# Patient Record
Sex: Male | Born: 1945 | Race: White | Hispanic: No
Health system: Southern US, Community
[De-identification: ages and names within clinical notes are randomized; demographics above are authoritative.]

## PROBLEM LIST (undated history)

## (undated) DIAGNOSIS — I10 Essential (primary) hypertension: Secondary | ICD-10-CM

## (undated) DIAGNOSIS — F0390 Unspecified dementia without behavioral disturbance: Secondary | ICD-10-CM

## (undated) DIAGNOSIS — I4891 Unspecified atrial fibrillation: Secondary | ICD-10-CM

## (undated) DIAGNOSIS — E7800 Pure hypercholesterolemia, unspecified: Secondary | ICD-10-CM

---

## 2003-10-23 ENCOUNTER — Ambulatory Visit (HOSPITAL_COMMUNITY): Admission: RE | Admit: 2003-10-23 | Discharge: 2003-10-23 | Payer: Self-pay | Admitting: Orthopedic Surgery

## 2004-01-29 ENCOUNTER — Observation Stay (HOSPITAL_COMMUNITY): Admission: RE | Admit: 2004-01-29 | Discharge: 2004-01-30 | Payer: Self-pay | Admitting: Orthopedic Surgery

## 2004-11-08 IMAGING — CR DG CHEST 2V
2 series · 2 of 2 positions shown · non-contrast
Comparison: No previous study for comparison.

CLINICAL DATA: Smoker.  Preoperative respiratory exam.
 CHEST, TWO VIEWS

[view not recorded (1 of 2)]
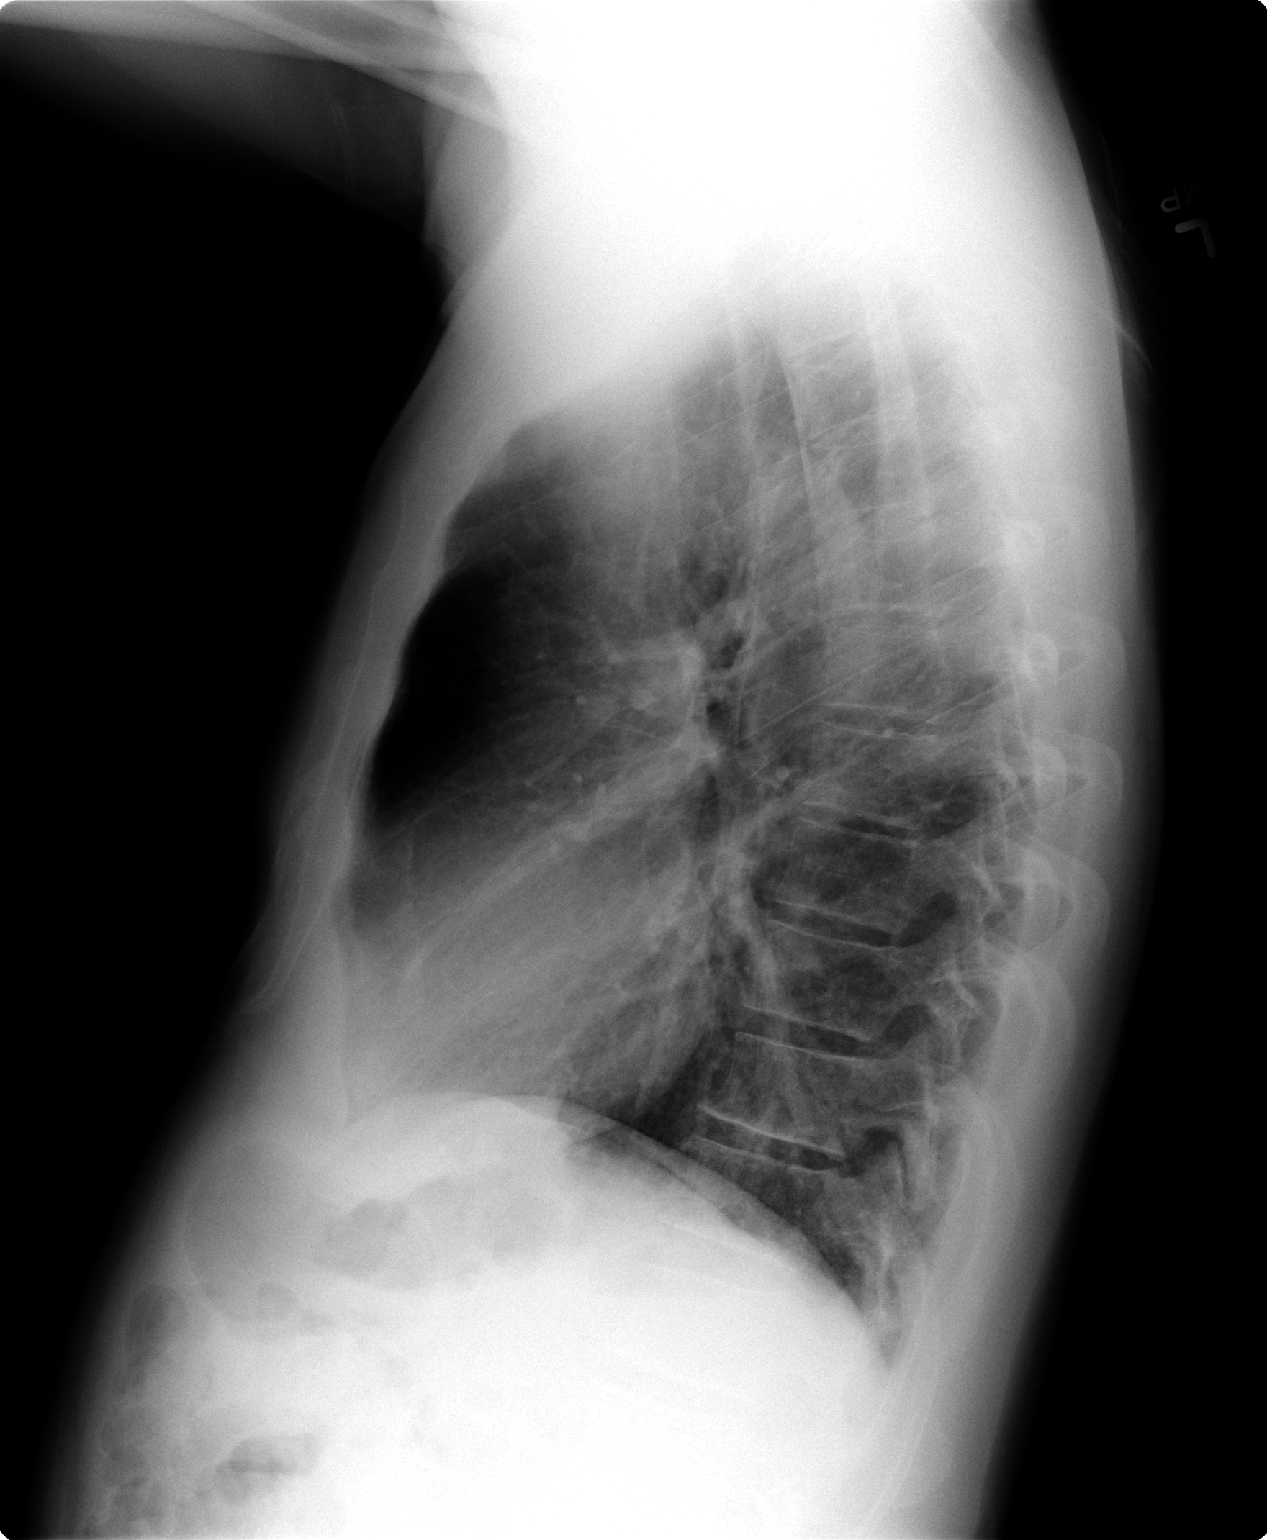

[view not recorded (2 of 2)]
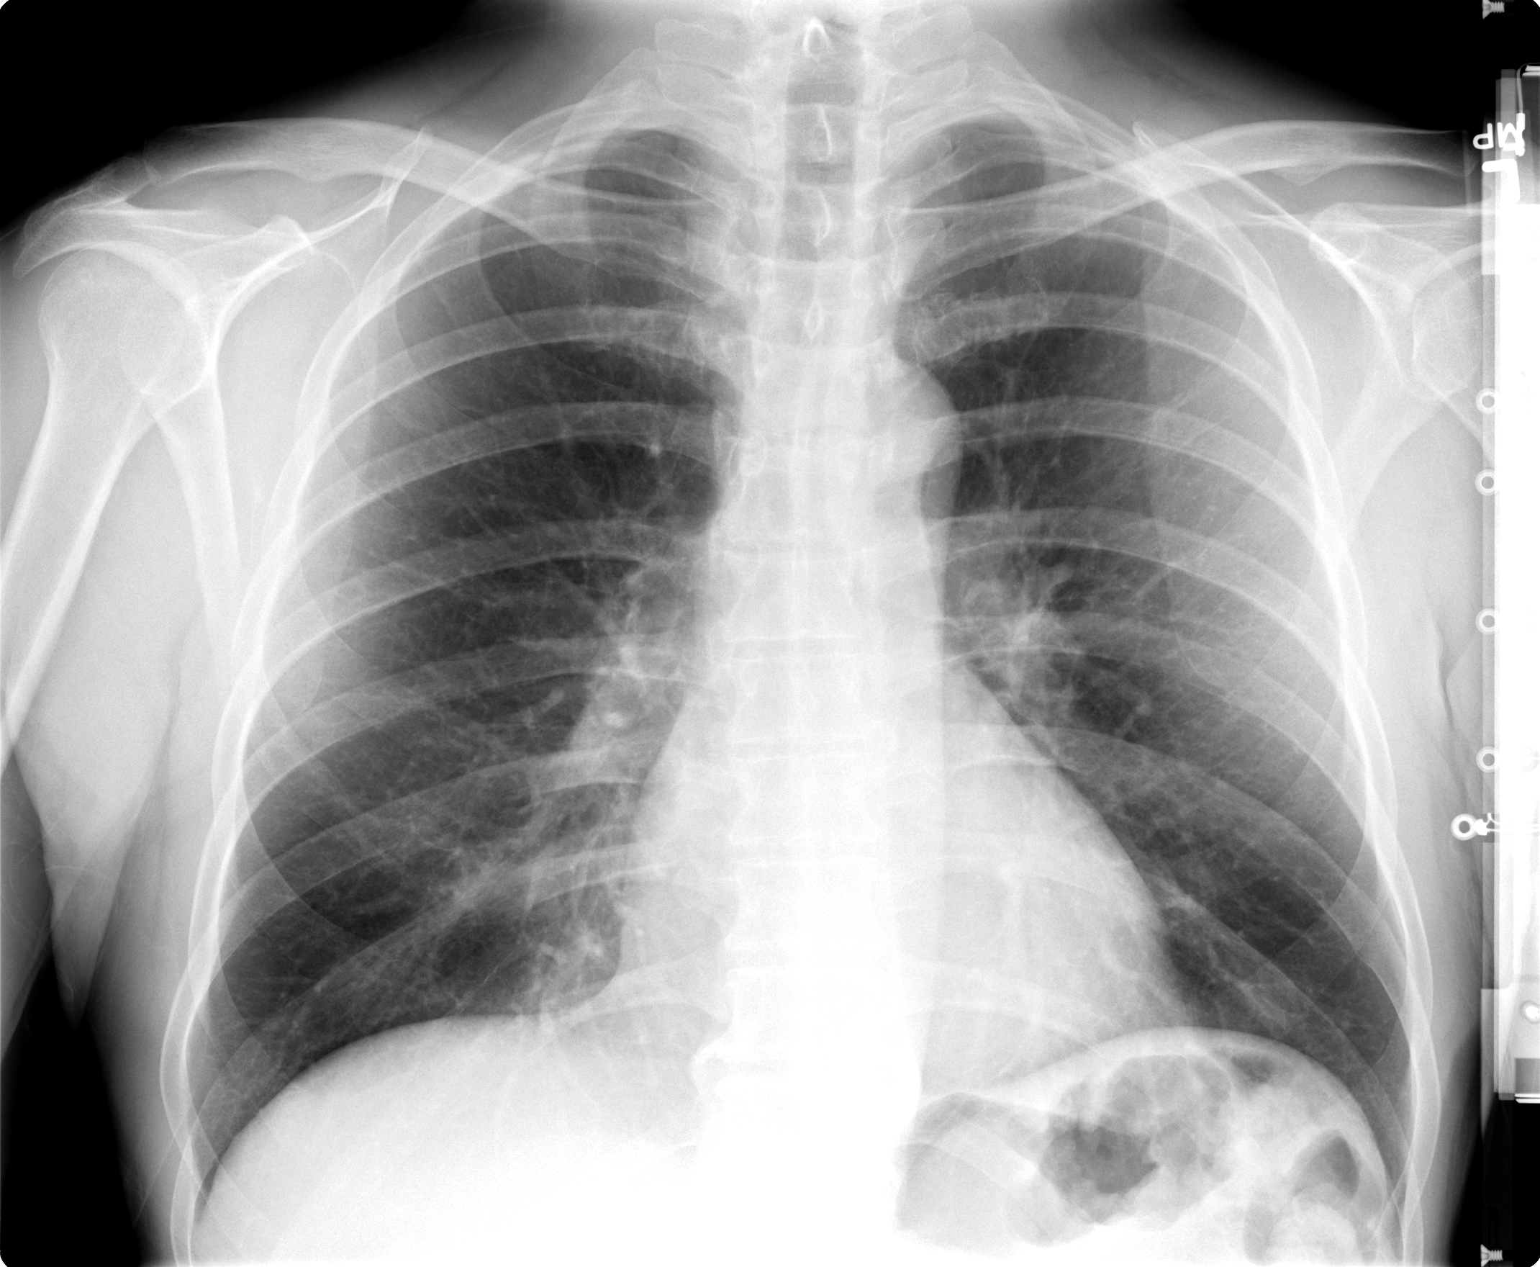

[2 of 2 positions shown; findings below may reference images not displayed]

The heart size and mediastinal contours are normal.  The lungs are clear.  The visualized skeleton is unremarkable. 
 IMPRESSION
 No active disease.

## 2021-05-03 NOTE — Progress Notes (Signed)
 Dear No primary care provider on file.,   Thank you for the opportunity to see Adam Stephens in neurological consultation.  Below, you will find my History and Physical report.  Please do not hesitate to contact my office if you have any questions or concerns.  Thank you for allowing me to participate in his care with you.    Sincerely, Norleen HILARIO Eagles, MD  Subjective   Patient ID:  Adam Stephens is a 75 y.o. (DOB [birth year]) male with a PMH of HTN, HLD, prior CVA, dementia who presents for falls.  His significant other is with him and gives much of the history.  He has been having balance issues going back for about 2 years.  She thinks he has had 5 or 6 falls this year.  He does the worst with turns and on uneven surfaces.  She feels he just gets off balance.  He did not offer a lot of insight into the falls.  He denies a sensation of vertigo or rocking.  No weakness.  He was unsure if the falls happen with turning.  He does have a history of neck pain that comes and goes.  They were told he has degenerative changes seen on an MRI few years ago.  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  He is followed at the memory center.   Imaging:   MRI brain - 2022, advanced atrophy and microvascular changes  Other workup: [facility] 2021, 13/3  Medications tried:   Other treatments:    Impression   1. Gait difficulty   2. Falls frequently   3. Alzheimer's dementia without behavioral disturbance, unspecified timing of dementia onset (*)      Neurologic exam as below.  He does not lift his feet well, has a widened base, appears unstable on the exam table, and is very unsteady walking.  I suspect his gait changes are likely multifactorial.  Alcohol use is particularly damaging to the peripheral nerves and cerebellum which would raise the possibility of some peripheral neuropathy and cerebellar dysfunction.  Given on and off neck pain, he may have a component of  myelopathy.  Patients with dementia often develop gait problems over time.  Plan   Orders Placed This Encounter  Procedures  . AMB REFERRAL TO PHYSICAL THERAPY EVALUATION AND TREATMENT   1.  I would not recommend any further imaging at this point.  MRI brain as above.  MRI C-spine could be considered, but he does not currently have neck pain and this has been evaluated in the past.  Additionally, I doubt he would be a good candidate for intervention.  2.  He already discontinued alcohol.  3.  Given the patient's elevated blood pressure reading, I have recommended a follow-up visit with his primary care provider.  4.  Trial of PT as above.  His significant other was hesitant about this because he was not good to keep up with the exercises he was shown the last time he went to therapy, but they were willing to try it again.  5.  Follow-up in 6 months.  Risks, benefits, and alternatives of the medications and treatment plan prescribed today were discussed.  The patient expressed understanding of the instructions.  I have reviewed the information contained in this note and personally verified its accuracy.  I obtained or reviewed the history of present illness and personally performed the physical exam. Norleen VEAR Eagles, MD   Past  Medical History, Past Surgery History, Allergies, Social History, and Family History were reviewed and updated.    Current Outpatient Medications on File Prior to Visit  Medication Sig Dispense Refill  . ALLERGY RELIEF 10 MG tablet Take one tablet (10 mg dose) by mouth as needed for Allergies. 90 tablet 1  . aspirin  81 mg chewable tablet Chew 81 mg by mouth daily.    . atorvastatin  (LIPITOR) 40 mg tablet TAKE ONE TABLET BY MOUTH DAILY 90 tablet 3  . diclofenac sodium (VOLTAREN) 75 mg EC tablet Take one tablet (75 mg dose) by mouth 2 (two) times daily. 60 tablet 1  . hydrochlorothiazide (HYDRODIURIL) 25 mg tablet TAKE ONE TABLET BY MOUTH EVERY DAY 90 tablet 1  .  lisinopril (PRINIVIL,ZESTRIL) 20 mg tablet Take one tablet (20 mg dose) by mouth daily. 90 tablet 2  . Misc Natural Products (OSTEO BI-FLEX JOINT SHIELD PO) Take by mouth daily.    . Multiple Vitamins-Minerals (ONE DAILY PLUS MINERALS) TABS Frequency:daily   Dosage:0.0     Instructions:Multivitamins ( TABS, 1 Oral daily)  Note:     Current Facility-Administered Medications on File Prior to Visit  Medication Dose Route Frequency Provider Last Rate Last Admin  . triamcinolone acetonide (KENALOG-40) 40 mg/mL injection 40 mg  40 mg Intra-articular Once PRN Oneil LITTIE Schillings, MD   40 mg at 10/07/19 1042      Objective   BP (!) 152/91 (BP Location: Left arm)   Pulse 62   Ht 5' 10 (1.778 m)   Wt 159 lb (72.1 kg)   BMI 22.81 kg/m   General:  Alert and oriented, no acute distress HENNT:  Neck is supple.  Eyes anicteric.  Moist oral mucosa. No carotid bruit. CV:  Regular rate and rhythm.  No obvious murmurs.   Chest:  Lungs are clear to auscultation. Extremities:  No peripheral edema Skin:  No rash  Neurologic:  Mental Status:  Pt is alert, cooperative, and follows commands well.  Speech is clear and language is normal.    Cranial Nerve Exam:    II - PERRL and near.  Visual fields are intact to confrontation.    III, IV, VI - There are full eye movements without nystagmus.  Choppy eye movements.  V - Facial sensation is normal.   VII - There is no facial weakness or asymmetry.  VIII - Auditory acuity is grossly normal.  IX, X - Uvula is midline and palate elevates symmetrically.  XI - Normal sternocleidomastoid and trapezius strength.  XII - Tongue does not deviate.   Motor Exam:  5/5 strength in the upper and lower extremities.  Good muscle tone and bulk.  No pronator drift.   Coordination: He does look unstable sitting on the table, and at one point, he put his hand down to steady himself during testing.  Some dysmetria on finger-to-nose.  Intact to RAM and fingertapping.  No tremor  noted.   Gait: He has a wide-based gait.  He does not lift his feet very well nor dorsiflex very well.  He appeared very unsteady even with his cane.   Reflexes:  2 in the upper extremities, 2 at the knees, and 1 on the right, 0 on the left at the ankles.      Sensation:  Intact to light touch.  *This note was dictated with voice recognition software. Inadvertently, similar sounding words can, sometimes, get transcribed incorrectly

## 2023-06-13 ENCOUNTER — Other Ambulatory Visit: Payer: Self-pay

## 2023-06-13 ENCOUNTER — Emergency Department (HOSPITAL_COMMUNITY)
Admission: EM | Admit: 2023-06-13 | Discharge: 2023-06-14 | Disposition: A | Discharge status: Home / Self Care | Payer: Medicare HMO | Attending: Emergency Medicine | Admitting: Emergency Medicine

## 2023-06-13 ENCOUNTER — Encounter (HOSPITAL_COMMUNITY): Payer: Self-pay

## 2023-06-13 ENCOUNTER — Emergency Department (HOSPITAL_COMMUNITY): Payer: Medicare HMO

## 2023-06-13 DIAGNOSIS — Z7901 Long term (current) use of anticoagulants: Secondary | ICD-10-CM | POA: Diagnosis not present

## 2023-06-13 DIAGNOSIS — S060XAA Concussion with loss of consciousness status unknown, initial encounter: Secondary | ICD-10-CM | POA: Diagnosis not present

## 2023-06-13 DIAGNOSIS — W050XXA Fall from non-moving wheelchair, initial encounter: Secondary | ICD-10-CM | POA: Diagnosis not present

## 2023-06-13 DIAGNOSIS — S161XXA Strain of muscle, fascia and tendon at neck level, initial encounter: Secondary | ICD-10-CM | POA: Insufficient documentation

## 2023-06-13 DIAGNOSIS — S0003XA Contusion of scalp, initial encounter: Secondary | ICD-10-CM | POA: Diagnosis present

## 2023-06-13 DIAGNOSIS — I10 Essential (primary) hypertension: Secondary | ICD-10-CM | POA: Insufficient documentation

## 2023-06-13 HISTORY — DX: Essential (primary) hypertension: I10

## 2023-06-13 HISTORY — DX: Pure hypercholesterolemia, unspecified: E78.00

## 2023-06-13 LAB — CBG MONITORING, ED: Glucose-Capillary: 100 mg/dL — ABNORMAL HIGH (ref 70–99)

## 2023-06-13 NOTE — ED Provider Notes (Signed)
[place] EMERGENCY DEPARTMENT AT [facility] Provider Note   CSN: [id number] Arrival date & time: 06/13/23  2305     History {Add pertinent medical, surgical, social history, OB history to HPI:1} Chief Complaint  Patient presents with   Fall    Fall on thinners    Adam Stephens is a 77 y.o. male.  The history is provided by the patient and the EMS personnel.  Patient brought in as a level 2 trauma from a local nursing facility.  Patient had a witnessed fall from his wheelchair.  Patient has difficulty ambulating at baseline.  Patient is on Eliquis.  Patient has a hematoma to the scalp.  No other signs of trauma.  Patient is at his baseline per EMS.    Past Medical History:  Diagnosis Date   High cholesterol    Hypertension     Home Medications Prior to Admission medications   Not on File      Allergies    Patient has no allergy information on record.    Review of Systems   Review of Systems  Constitutional:  Negative for fever.    Physical Exam Updated Vital Signs BP (!) 165/93 (BP Location: Left Arm)   Pulse 78   Temp 98.2 F (36.8 C) (Oral)   Resp 19   SpO2 99%  Physical Exam CONSTITUTIONAL: elderly, no distress HEAD: hematoma to forehead EYES: EOMI/PERRL ENMT: Mucous membranes moist NECK: c-collar in place SPINE/BACK:entire spine nontender No bruising/crepitance/stepoffs noted to spine CV: no loud murmurs LUNGS: Lungs are clear to auscultation bilaterally, no apparent distress ABDOMEN: soft, nontender NEURO: Pt is awake/alert, moves all extremitiesx4.  No facial droop.  No arm/leg weakness EXTREMITIES: pulses normal/equal, full ROM Pelvis stable All other extremities/joints palpated/ranged and nontender SKIN: warm, color normal  ED Results / Procedures / Treatments   Labs (all labs ordered are listed, but only abnormal results are displayed) Labs Reviewed  CBG MONITORING, ED - Abnormal; Notable for the following components:       Result Value   Glucose-Capillary 100 (*)    All other components within normal limits  CBG MONITORING, ED    EKG EKG Interpretation Date/Time:  Wednesday June 13 2023 23:14:53 EDT Ventricular Rate:  69 PR Interval:  169 QRS Duration:  93 QT Interval:  418 QTC Calculation: 448 R Axis:   59  Text Interpretation: Sinus rhythm Low voltage, precordial leads Interpretation limited secondary to artifact Confirmed by Zadie Rhine ([id number]) on 06/13/2023 11:43:24 PM  Radiology DG Chest Portable 1 View  Result Date: 06/13/2023 CLINICAL DATA:  Recent fall with chest pain, initial encounter EXAM: PORTABLE CHEST 1 VIEW COMPARISON:  01/28/2004 FINDINGS: Cardiac shadow is within normal limits. Aortic calcifications are seen. Lungs are well aerated bilaterally. Patchy atelectatic changes are noted in the left base. Mild rib deformity is noted of the left eighth rib of uncertain chronicity. No pneumothorax is noted. IMPRESSION: Patchy left basilar atelectasis. Eighth rib deformity on the left of uncertain chronicity. Electronically Signed   By: Alcide Clever M.D.   On: 06/13/2023 23:24    Procedures Procedures  {Document cardiac monitor, telemetry assessment procedure when appropriate:1}  Medications Ordered in ED Medications - No data to display  ED Course/ Medical Decision Making/ A&P   {   Click here for ABCD2, HEART and other calculatorsREFRESH Note before signing :1}        Glasgow Coma Scale Score: 14      NEXUS Criteria Score: 1  Medical Decision Making Amount and/or Complexity of Data Reviewed Radiology: ordered.   This patient presents to the ED for concern of fall with head injury, this involves an extensive number of treatment options, and is a complaint that carries with it a high risk of complications and morbidity.  The differential diagnosis includes but is not limited to subdural hematoma, subarachnoid hemorrhage, skull fracture, cervical spine  fracture  Comorbidities that complicate the patient evaluation: Patient's presentation is complicated by their history of ***  Social Determinants of Health: Patient's  nursing home residents   increases the complexity of managing their presentation  Additional history obtained: Additional history obtained from EMS  and nursing home/care facility Records reviewed  previous records reviewed  Lab Tests: I Ordered, and personally interpreted labs.  The pertinent results include: Normal glucose  Imaging Studies ordered: I ordered imaging studies including CT scan head and C-spine and X-ray chest   I independently visualized and interpreted imaging which showed *** I agree with the radiologist interpretation  Cardiac Monitoring: The patient was maintained on a cardiac monitor.  I personally viewed and interpreted the cardiac monitor which showed an underlying rhythm of:  {cardiac monitor:26849}  Medicines ordered and prescription drug management: I ordered medication including ***  for ***  Reevaluation of the patient after these medicines showed that the patient    {resolved/improved/worsened:23923::"improved"}  Test Considered: Patient is low risk / negative by ***, therefore do not feel that *** is indicated.  Critical Interventions:  ***  Consultations Obtained: I requested consultation with the {consultation:26851}, and discussed  findings as well as pertinent plan - they recommend: ***  Reevaluation: After the interventions noted above, I reevaluated the patient and found that they have :{resolved/improved/worsened:23923::"improved"}  Complexity of problems addressed: Patient's presentation is most consistent with  {NFAO:13086}  Disposition: After consideration of the diagnostic results and the patient's response to treatment,  I feel that the patent would benefit from {disposition:26850}.     {Document critical care time when appropriate:1} {Document review of labs  and clinical decision tools ie heart score, Chads2Vasc2 etc:1}  {Document your independent review of radiology images, and any outside records:1} {Document your discussion with family members, caretakers, and with consultants:1} {Document social determinants of health affecting pt's care:1} {Document your decision making why or why not admission, treatments were needed:1} Final Clinical Impression(s) / ED Diagnoses Final diagnoses:  None    Rx / DC Orders ED Discharge Orders     None

## 2023-06-13 NOTE — ED Notes (Signed)
Son [name] called.

## 2023-06-13 NOTE — ED Triage Notes (Signed)
Pt BIB [facility] from [facility]. Pt had a witnessed fall from a WC. Pt hit L side of forehead. Pt is on eliquis and ASA.   EMS VS HR 76 BP 160/90 R 14 O2 97% RA CBG 175

## 2023-06-13 NOTE — Progress Notes (Signed)
   06/13/23 2321  Spiritual Encounters  Type of Visit Initial  Care provided to: Patient  Referral source Trauma page  Reason for visit Trauma  OnCall Visit Yes  Advance Directives (For Healthcare)  Does Patient Have a Medical Advance Directive? Yes  Type of Advance Directive Out of facility DNR (pink MOST or yellow form)  Out of facility DNR (pink MOST or yellow form) in Chart? (Ambulatory ONLY) Yes - validated most recent copy scanned in chart  Pre-existing out of facility DNR order (yellow form or pink MOST form) Pink Most/Yellow Form available - Physician notified to receive inpatient order  Was copy submitted for scanning? Yes  Mental Health Advance Directives  Does Patient Have a Mental Health Advance Directive? No   Ch responded to trauma page. There was no family at bedside. Ch provided hospitality and support to staff. No follow-up needed at this time.

## 2023-12-08 ENCOUNTER — Emergency Department (HOSPITAL_COMMUNITY): Payer: Medicare HMO

## 2023-12-08 ENCOUNTER — Other Ambulatory Visit: Payer: Self-pay

## 2023-12-08 ENCOUNTER — Observation Stay (HOSPITAL_COMMUNITY)
Admission: EM | Admit: 2023-12-08 | Discharge: 2023-12-09 | Disposition: A | Discharge status: Other Acute Inpatient Hospital | Payer: Medicare HMO | Attending: Family Medicine | Admitting: Family Medicine

## 2023-12-08 ENCOUNTER — Encounter (HOSPITAL_COMMUNITY): Payer: Self-pay | Admitting: Pharmacy Technician

## 2023-12-08 DIAGNOSIS — G309 Alzheimer's disease, unspecified: Secondary | ICD-10-CM | POA: Insufficient documentation

## 2023-12-08 DIAGNOSIS — Z79899 Other long term (current) drug therapy: Secondary | ICD-10-CM | POA: Diagnosis not present

## 2023-12-08 DIAGNOSIS — D649 Anemia, unspecified: Secondary | ICD-10-CM | POA: Insufficient documentation

## 2023-12-08 DIAGNOSIS — R109 Unspecified abdominal pain: Secondary | ICD-10-CM | POA: Insufficient documentation

## 2023-12-08 DIAGNOSIS — I4819 Other persistent atrial fibrillation: Principal | ICD-10-CM | POA: Insufficient documentation

## 2023-12-08 DIAGNOSIS — Z7901 Long term (current) use of anticoagulants: Secondary | ICD-10-CM | POA: Insufficient documentation

## 2023-12-08 DIAGNOSIS — Z87891 Personal history of nicotine dependence: Secondary | ICD-10-CM | POA: Insufficient documentation

## 2023-12-08 DIAGNOSIS — Z8673 Personal history of transient ischemic attack (TIA), and cerebral infarction without residual deficits: Secondary | ICD-10-CM | POA: Insufficient documentation

## 2023-12-08 DIAGNOSIS — I4891 Unspecified atrial fibrillation: Secondary | ICD-10-CM | POA: Diagnosis not present

## 2023-12-08 DIAGNOSIS — F0280 Dementia in other diseases classified elsewhere without behavioral disturbance: Secondary | ICD-10-CM | POA: Insufficient documentation

## 2023-12-08 DIAGNOSIS — I10 Essential (primary) hypertension: Secondary | ICD-10-CM | POA: Insufficient documentation

## 2023-12-08 DIAGNOSIS — I739 Peripheral vascular disease, unspecified: Secondary | ICD-10-CM | POA: Diagnosis not present

## 2023-12-08 DIAGNOSIS — W19XXXA Unspecified fall, initial encounter: Secondary | ICD-10-CM | POA: Diagnosis not present

## 2023-12-08 DIAGNOSIS — R479 Unspecified speech disturbances: Secondary | ICD-10-CM | POA: Diagnosis present

## 2023-12-08 LAB — COMPREHENSIVE METABOLIC PANEL
ALT: 10 U/L (ref 0–44)
AST: 20 U/L (ref 15–41)
Albumin: 3.5 g/dL (ref 3.5–5.0)
Alkaline Phosphatase: 94 U/L (ref 38–126)
Anion gap: 11 (ref 5–15)
BUN: 16 mg/dL (ref 8–23)
CO2: 22 mmol/L (ref 22–32)
Calcium: 9.7 mg/dL (ref 8.9–10.3)
Chloride: 103 mmol/L (ref 98–111)
Creatinine, Ser: 1.15 mg/dL (ref 0.61–1.24)
GFR, Estimated: >60 mL/min (ref >60)
Glucose, Bld: 81 mg/dL (ref 70–99)
Potassium: 4.3 mmol/L (ref 3.5–5.1)
Sodium: 136 mmol/L (ref 135–145)
Total Bilirubin: 0.6 mg/dL (ref 0.0–1.2)
Total Protein: 6.6 g/dL (ref 6.5–8.1)

## 2023-12-08 LAB — URINALYSIS, ROUTINE W REFLEX MICROSCOPIC
Bilirubin Urine: NEGATIVE
Glucose, UA: NEGATIVE mg/dL
Hgb urine dipstick: NEGATIVE
Ketones, ur: NEGATIVE mg/dL
Nitrite: NEGATIVE
Protein, ur: NEGATIVE mg/dL
Specific Gravity, Urine: 1.005 (ref 1.005–1.030)
pH: 8 (ref 5.0–8.0)

## 2023-12-08 LAB — CBC WITH DIFFERENTIAL/PLATELET
Abs Immature Granulocytes: 0.04 10*3/uL (ref 0.00–0.07)
Basophils Absolute: 0.1 10*3/uL (ref 0.0–0.1)
Basophils Relative: 1 %
Eosinophils Absolute: 0.4 10*3/uL (ref 0.0–0.5)
Eosinophils Relative: 4 %
HCT: 37.9 % — ABNORMAL LOW (ref 39.0–52.0)
Hemoglobin: 12.4 g/dL — ABNORMAL LOW (ref 13.0–17.0)
Immature Granulocytes: 0 %
Lymphocytes Relative: 20 %
Lymphs Abs: 2.1 10*3/uL (ref 0.7–4.0)
MCH: 32.2 pg (ref 26.0–34.0)
MCHC: 32.7 g/dL (ref 30.0–36.0)
MCV: 98.4 fL (ref 80.0–100.0)
Monocytes Absolute: 0.9 10*3/uL (ref 0.1–1.0)
Monocytes Relative: 8 %
Neutro Abs: 6.7 10*3/uL (ref 1.7–7.7)
Neutrophils Relative %: 67 %
Platelets: 259 10*3/uL (ref 150–400)
RBC: 3.85 MIL/uL — ABNORMAL LOW (ref 4.22–5.81)
RDW: 14 % (ref 11.5–15.5)
WBC: 10.2 10*3/uL (ref 4.0–10.5)
nRBC: 0 % (ref 0.0–0.2)

## 2023-12-08 LAB — CBG MONITORING, ED: Glucose-Capillary: 88 mg/dL (ref 70–99)

## 2023-12-08 MED ORDER — AMIODARONE HCL IN DEXTROSE 360-4.14 MG/200ML-% IV SOLN
30.0000 mg/h | INTRAVENOUS | Status: DC
  Administered 2023-12-08 – 2023-12-09 (×2): 30 mg/h via INTRAVENOUS
  Filled 2023-12-08: qty 200

## 2023-12-08 MED ORDER — IOHEXOL 350 MG/ML SOLN
75.0000 mL | Freq: Once | INTRAVENOUS | Status: AC | PRN
  Administered 2023-12-08: 75 mL via INTRAVENOUS

## 2023-12-08 MED ORDER — ATORVASTATIN CALCIUM 40 MG PO TABS
40.0000 mg | ORAL_TABLET | Freq: Every day | ORAL | Status: DC
  Administered 2023-12-08: 40 mg via ORAL
  Filled 2023-12-08: qty 1

## 2023-12-08 MED ORDER — AMIODARONE LOAD VIA INFUSION
150.0000 mg | Freq: Once | INTRAVENOUS | Status: AC
  Administered 2023-12-08: 150 mg via INTRAVENOUS
  Filled 2023-12-08: qty 83.34

## 2023-12-08 MED ORDER — TAMSULOSIN HCL 0.4 MG PO CAPS
0.4000 mg | ORAL_CAPSULE | Freq: Every day | ORAL | Status: DC
  Filled 2023-12-08: qty 1

## 2023-12-08 MED ORDER — SODIUM CHLORIDE 0.9 % IV BOLUS
500.0000 mL | Freq: Once | INTRAVENOUS | Status: AC
  Administered 2023-12-08: 500 mL via INTRAVENOUS

## 2023-12-08 MED ORDER — FOLIC ACID 1 MG PO TABS
500.0000 ug | ORAL_TABLET | Freq: Every day | ORAL | Status: DC
  Administered 2023-12-08 – 2023-12-09 (×2): 0.5 mg via ORAL
  Filled 2023-12-08 (×2): qty 1

## 2023-12-08 MED ORDER — APIXABAN 5 MG PO TABS
5.0000 mg | ORAL_TABLET | Freq: Two times a day (BID) | ORAL | Status: DC
  Administered 2023-12-08 – 2023-12-09 (×3): 5 mg via ORAL
  Filled 2023-12-08 (×3): qty 1

## 2023-12-08 MED ORDER — ACETAMINOPHEN 650 MG RE SUPP: 650.0000 mg | Freq: Four times a day (QID) | RECTAL | Status: DC | PRN

## 2023-12-08 MED ORDER — MIRTAZAPINE 7.5 MG PO TABS
7.5000 mg | ORAL_TABLET | Freq: Every day | ORAL | Status: DC
  Administered 2023-12-08: 7.5 mg via ORAL
  Filled 2023-12-08: qty 1

## 2023-12-08 MED ORDER — ACETAMINOPHEN 325 MG PO TABS: 650.0000 mg | ORAL_TABLET | Freq: Four times a day (QID) | ORAL | Status: DC | PRN

## 2023-12-08 MED ORDER — AMIODARONE HCL IN DEXTROSE 360-4.14 MG/200ML-% IV SOLN
60.0000 mg/h | INTRAVENOUS | Status: AC
  Administered 2023-12-08 (×2): 60 mg/h via INTRAVENOUS
  Filled 2023-12-08 (×2): qty 200

## 2023-12-08 MED ORDER — ASPIRIN 81 MG PO CHEW
81.0000 mg | CHEWABLE_TABLET | Freq: Every day | ORAL | Status: DC
  Filled 2023-12-08: qty 1

## 2023-12-08 MED ORDER — POLYETHYLENE GLYCOL 3350 17 G PO PACK: 17.0000 g | PACK | Freq: Every day | ORAL | Status: DC

## 2023-12-08 NOTE — Plan of Care (Signed)
FMTS Brief Progress Note  S:Seen on evening rounds. His significant other is at bedside. They deny any acute concerns. He tells me he feels "pretty good." Sinus rhythm on the monitor.    O: BP (!) 104/54   Pulse 63   Temp 98.2 F (36.8 C) (Oral)   Resp 13   Ht 5\' 10"  (1.778 m)   Wt 81.6 kg   SpO2 100%   BMI 25.83 kg/m   Gen: Elderly and chronically ill appearing but comfortable and NAD Cardio: RRR Pulm: Normal WOB on RA  A/P: Fall Trauma workup negative for bleed or acute fractures. ?Whether this was 2/2 frailty, impaired gait vs hypoperfusion 2/2 orthostasis/baseline bradycardia vs AF RVR.  - Management of A fib as below  - Holding his B-blocker  A Fib w/ RVR RVR with hypotension in the ED, s/p fluid bolus and loading dose of IV amio. Rates now controlled.  - Transition to PO amiodarone tomorrow morning 1/19 - Anticipate discharge back to his long-term care facility as early as tomorrow   - Orders reviewed. Labs for AM ordered, which was adjusted as needed.    Alicia Amel, MD 12/08/2023, 8:06 PM PGY-3, [facility] Night Resident  Please page [phone] with questions.

## 2023-12-08 NOTE — H&P (Cosign Needed Addendum)
Hospital Admission History and Physical Service Pager: [phone]  Patient name: Adam Stephens Medical record number: [id number] Date of Birth: [birth year] Age: 78 y.o. Gender: male  Primary Care Provider: Pcp, No Consultants: none Code Status: DNR/DNI which was confirmed with family and paperwork at bedside Preferred Emergency Contact:  Contact Information     Name Relation Home Work Mobile   [place]  [phone]  [phone]   Michael,Cathy Significant other   [phone]      Other Contacts   None on File     Chief Complaint: atrial fibrillation with RVR  Assessment and Plan: Adam Stephens is a 78 y.o. male presenting with atrial fibrillation with RVR after being brought to the ED for a fall at his memory care facility.  Patient found to be in A-fib with rates as high as the 150s in the ED, with dips into the 30s and 40s.  Will start amiodarone drip at this time for rate control and add back home metoprolol as able.  Differential for fall: Orthostatic hypotension: Soft BP in ED, responded initially with 1L fluid bolus. Arrhythmia: Patient in A. Fib RVR. Moments of sinus bradycardia. Gait impairment: Hx of dementia and wheelchair bound, patient is frail CVA: Hx of CVA, however on ASA + Eliquis. Reassuringly exam is baseline per family member at bedside and CT head without acute abnormalities. Assessment & Plan Atrial fibrillation with RVR (HCC) On Metoprolol at home for rate control.  History of RVR, most recently May 2024.  Patient is asymptomatic at this time.  Holding home metoprolol due to dips in heart rate to 30s to 40s in the ED and soft blood pressures. Echo obtained at [facility] in 03/2023: reported a normal size LV, EF 55 to 60%, normal size left at[facility], and no evidence of interatrial shunt. Will not pursue repeat echo at this time. - Admit to Med-tele, attending Dr. Lum Babe - Cardiology consulted, appreciate recommendations - amiodarone gtt - Continue home  Eliquis 5mg  BID - am CBC, BMP Fall Unwitnessed fall at memory care facility.  Chest x-ray, pelvis x-ray, CT C-spine negative for acute injury. CT head showing right frontal scalp contusion, otherwise unremarkable for acute abnormality.  CT chest abdomen pelvis with chronic rib fractures, mild circumferential thickening of rectum (no bowel symptoms reported), and chronic bladder outlet obstruction. U/A unremarkable.  - Fall precautions - Hold aspirin Anemia Hemoglobin 12.4 on arrival, however on chart review it looks like this is his baseline.  Asymptomatic.  Daughter-in-law states that he has a history of hematuria which was extensively work-up with Urology including a cystoscopy that was unremarkable. No recent Hematuria and U/A without hgb. - AM CBC - Continue folate daily  Chronic and Stable Problems:  HTN - stable HLD - continue home atorvastatin 40mg  QD Alzheimer's disease - continue Remeron 7.5mg  at bedtime, delirium precautions CVA - holding aspirin 81mg  daily Urinary retention - not an issue currently, continue home flomax 0.4mg  at bedtime CKD3a - stable Anemia - continue folate  FEN/GI: Dysphagia 1 with nectar thick liquids VTE Prophylaxis: Home Eliquis  Disposition: Med telemetry  History of Present Illness:  Adam Stephens is a 78 y.o. male presenting from [facility] facility after a fall.  Patient was found on the ground by nursing staff and he takes Eliquis.  Patient does not remember how he fell.  He has no complaints at this time. In the ED, patient went into A-fib with RVR.  His rates initially improved  after 2 500 cc boluses, however they increased again.  Patient did not receive metoprolol because his rates were dropping into the 30s and 40s in the ED as well so we were called for admission. Apparently the facility he is not does not have a medical provider available on the weekends to check vitals or respond to them. Pt was diagnosed with atrial  fibrillation in May 2024 and started on Metoprolol. Anticoagulation was not started at that time due to falls, then pt had CVA a few days later and discharged from hospital on Eliquis.   Review Of Systems: Per HPI with the following additions: None  Pertinent Past Medical History: Paroxysmal Afib - on eliquis and metoprolol HTN HLD Alzheimer's disease CVA Urinary retention CKD3a  Remainder reviewed in history tab.   Pertinent Past Surgical History: None reported   Remainder reviewed in history tab.  Pertinent Social History: Tobacco use: Former  Alcohol use: No Other Substance use: No Lives at [facility]  Pertinent Family History: N/A  Remainder reviewed in history tab.   Important Outpatient Medications: Eliquis 5mg  BID Metoprolol tartrate 25mg  BID Potassium daily Aspirin 81mg  daily Atorvastatin 40mg  daily Folate daily Melatonin 10mg  at bedtime Remeron 7.5mg  daily at bedtime Flomax 0.4mg  daily at bedtime  Remainder reviewed in medication history.   Objective: BP (!) 107/92   Pulse (!) 104   Temp 98.8 F (37.1 C)   Resp 16   Ht 5\' 10"  (1.778 m)   Wt 81.6 kg   SpO2 100%   BMI 25.83 kg/m  Exam: General: Well-appearing, no acute distress Eyes: EOMI, no periorbital ecchymosis ENTM: Slightly dry mucous membranes. Cardiovascular: Irregularly irregular rate, tachycardic.  Systolic murmur best heard between ribs 5-6 Respiratory: Normal work of breathing on room air, clear to auscultation bilaterally Gastrointestinal: Normal bowel sounds, soft, nontender MSK: No leg swelling bilateral lower extremities.  Full range of motion bilateral upper and bilateral lower extremities.  No tenderness to palpation of back.  No obvious bony deformities or signs of injury. Derm: Small hemostatic skin tear noted to forehead. Neuro: Alert and oriented to person Psych: Pleasant affect  Labs:  CBC BMET  Recent Labs  Lab 12/08/23 0900  WBC  10.2  HGB 12.4*  HCT 37.9*  PLT 259   Recent Labs  Lab 12/08/23 0900  NA 136  K 4.3  CL 103  CO2 22  BUN 16  CREATININE 1.15  GLUCOSE 81  CALCIUM 9.7    U/A negative CBG 88  EKG: Sinus bradycardia at 40 BPM, Qtc   Imaging Studies Performed:  CXR 12/08/23 IMPRESSION: 1.  No acute fracture.  2. Mild prominence of the central perihilar markings suggesting mild degree of vascular congestion.  XR Pelvis 12/08/23 IMPRESSION: 1. No acute radiographic abnormality of the bony pelvis. 2. Moderate osteoarthritis of the hips bilaterally.  CT Head WO Contrast 12/08/23 IMPRESSION: 1. Right frontal scalp contusion without underlying calvarial fracture or acute intracranial abnormality. 2. No evidence of acute traumatic injury to the cervical spine. 3. 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 4. Multilevel degenerative disc disease and cervical spondylosis, as above. 5. Aortic atherosclerosis  CT C-Spine WO Contrast 12/08/23 IMPRESSION: 1. Right frontal scalp contusion without underlying calvarial fracture or acute intracranial abnormality. 2. No evidence of acute traumatic injury to the cervical spine. 3. The appearance of the brain is remarkable  for advanced cerebral and cerebellar atrophy, chronic microvascular ischemic disease, and multiple old lacunar infarcts, as above. 4. Multilevel degenerative disc disease and cervical spondylosis, as above. 5. Aortic atherosclerosis.  CT C/A/P W Contarst 12/08/23 IMPRESSION: 1. Mild bilateral ground-glass opacities and interlobular septal line thickening may reflect mild pulmonary edema. 2. Left eighth through tenth rib fractures are favored to be chronic. Correlation with point tenderness in this area could be helpful to confirm. 3. Mild circumferential thickening of the rectum without  significant surrounding inflammatory changes may reflect proctitis. 4. Diffuse bladder wall thickening with trabeculation may reflect chronic bladder outlet obstruction.  Everhart, Tamala Julian, DO 12/08/2023, 2:11 PM PGY-1, [facility]  FPTS Intern pager: [phone], text pages welcome Secure chat group [facility] Teaching Service   Upper Level Addendum: I have seen and evaluated this patient along with Dr. Rexene Alberts and reviewed the above note, making necessary revisions as appropriate. I agree with the medical decision making and physical exam as noted above. Tiffany Kocher, DO PGY-2 [facility] Res[facility]

## 2023-12-08 NOTE — Assessment & Plan Note (Addendum)
On Metoprolol at home for rate control.  History of RVR, most recently May 2024.  Patient is asymptomatic at this time.  Holding home metoprolol due to dips in heart rate to 30s to 40s in the ED and soft blood pressures. Echo obtained at [facility] in 03/2023: reported a normal size LV, EF 55 to 60%, normal size left at[facility], and no evidence of interatrial shunt. Will not pursue repeat echo at this time. - Admit to Med-tele, attending Dr. Lum Babe - Cardiology consulted, appreciate recommendations - amiodarone gtt - Continue home Eliquis 5mg  BID - am CBC, BMP

## 2023-12-08 NOTE — Progress Notes (Signed)
Orthopedic Tech Progress Note Patient Details:  Adam Stephens 04/23/1946 [id number]  Patient ID: [name], male   DOB: March 16, 1946, 78 y.o.   MRN: [id number] Checked in for Level 2 trauma.  Blase Mess 12/08/2023, 8:11 AM

## 2023-12-08 NOTE — ED Provider Notes (Signed)
[place] EMERGENCY DEPARTMENT AT [facility] Provider Note   CSN: [id number] Arrival date & time: 12/08/23  9147     History  Chief Complaint  Patient presents with   Lelan Pons is a 78 y.o. male.   Fall  78 year old male history of hypertension presenting for fall.  Patient presents from a nursing home.  Per EMS he is at his baseline with some difficulty with speech and confusion.  He was found on the floor this morning and presumably fell out of bed.  He had 2 hematomas to the top of his head.  He was in A-fib and intermittently tachycardic with EMS.  He is on Eliquis.  Blood pressure was stable.  He did not have any complaints.  He is otherwise been at his baseline health per EMS.  Per review of his MAR from his facility he did not receive his home 25 mg metoprolol tartrate this morning.     Home Medications Prior to Admission medications   Medication Sig Start Date End Date Taking? Authorizing Provider  apixaban (ELIQUIS) 5 MG TABS tablet Take 5 mg by mouth 2 (two) times daily. 04/07/23  Yes [provider]  ASPIRIN LOW DOSE 81 MG chewable tablet Chew 81 mg by mouth daily.   Yes [provider]  atorvastatin (LIPITOR) 40 MG tablet Take 40 mg by mouth at bedtime.   Yes [provider]  folic acid (FOLVITE) 800 MCG tablet Take 400 mcg by mouth daily.   Yes [provider]  GOODSENSE CLEARLAX 17 GM/SCOOP powder Take 17 g by mouth daily. 08/08/23  Yes [provider]  Melatonin 10 MG TABS Take 10 mg by mouth at bedtime.   Yes [provider]  metoprolol tartrate (LOPRESSOR) 25 MG tablet Take 25 mg by mouth 2 (two) times daily.   Yes [provider]  mirtazapine (REMERON) 7.5 MG tablet Take 7.5 mg by mouth at bedtime.   Yes [provider]  potassium chloride SA (KLOR-CON M) 20 MEQ tablet Take 20 mEq by mouth daily. 11/02/23  Yes [provider]  tamsulosin (FLOMAX) 0.4 MG CAPS  capsule Take 0.4 mg by mouth at bedtime.   Yes [provider]      Allergies    Aliskiren, Amlodipine, and Hydrochlorothiazide    Review of Systems   Review of Systems Review of systems completed and notable as per HPI.  ROS otherwise negative.   Physical Exam Updated Vital Signs BP 127/75   Pulse 70   Temp 98.8 F (37.1 C)   Resp 12   Ht 5\' 10"  (1.778 m)   Wt 81.6 kg   SpO2 99%   BMI 25.83 kg/m  Physical Exam Vitals and nursing note reviewed.  Constitutional:      General: He is not in acute distress.    Appearance: He is well-developed.  HENT:     Head: Normocephalic.     Comments: 2 small hematomas to the top of the head.  No bleeding.    Nose: Nose normal.     Mouth/Throat:     Mouth: Mucous membranes are moist.     Pharynx: Oropharynx is clear.  Eyes:     Extraocular Movements: Extraocular movements intact.     Conjunctiva/sclera: Conjunctivae normal.     Pupils: Pupils are equal, round, and reactive to light.  Cardiovascular:     Rate and Rhythm: Normal rate. Rhythm irregular.     Pulses: Normal  pulses.     Heart sounds: Normal heart sounds. No murmur heard. Pulmonary:     Effort: Pulmonary effort is normal. No respiratory distress.     Breath sounds: Normal breath sounds.  Abdominal:     Palpations: Abdomen is soft.     Tenderness: There is no abdominal tenderness.  Musculoskeletal:        General: No swelling.     Cervical back: Neck supple. No tenderness.     Right lower leg: No edema.     Left lower leg: No edema.  Skin:    General: Skin is warm and dry.     Capillary Refill: Capillary refill takes less than 2 seconds.     Comments: No spinal tenderness  Neurological:     General: No focal deficit present.     Mental Status: He is alert and oriented to person, place, and time. Mental status is at baseline.  Psychiatric:        Mood and Affect: Mood normal.     ED Results / Procedures / Treatments   Labs (all labs ordered are  listed, but only abnormal results are displayed) Labs Reviewed  CBC WITH DIFFERENTIAL/PLATELET - Abnormal; Notable for the following components:      Result Value   RBC 3.85 (*)    Hemoglobin 12.4 (*)    HCT 37.9 (*)    All other components within normal limits  URINALYSIS, ROUTINE W REFLEX MICROSCOPIC - Abnormal; Notable for the following components:   Color, Urine STRAW (*)    Leukocytes,Ua SMALL (*)    Bacteria, UA FEW (*)    All other components within normal limits  COMPREHENSIVE METABOLIC PANEL  CBG MONITORING, ED    EKG EKG Interpretation Date/Time:  Saturday December 08 2023 11:38:31 EST Ventricular Rate:  40 PR Interval:  173 QRS Duration:  96 QT Interval:  431 QTC Calculation: 352 R Axis:   51  Text Interpretation: Sinus bradycardia Confirmed by Fulton Reek [phone]) on 12/08/2023 11:53:35 AM  Radiology CT CHEST ABDOMEN PELVIS W CONTRAST Result Date: 12/08/2023 CLINICAL DATA:  Fall with chest and abdomen pain. EXAM: CT CHEST, ABDOMEN, AND PELVIS WITH CONTRAST TECHNIQUE: Multidetector CT imaging of the chest, abdomen and pelvis was performed following the standard protocol during bolus administration of intravenous contrast. RADIATION DOSE REDUCTION: This exam was performed according to the departmental dose-optimization program which includes automated exposure control, adjustment of the mA and/or kV according to patient size and/or use of iterative reconstruction technique. CONTRAST:  75mL OMNIPAQUE IOHEXOL 350 MG/ML SOLN COMPARISON:  Same day chest radiograph and pelvis radiograph. FINDINGS: CT CHEST FINDINGS Cardiovascular: Vascular calcifications are seen in the coronary arteries and aortic arch. Normal heart size. No pericardial effusion. Mediastinum/Nodes: No enlarged mediastinal, hilar, or axillary lymph nodes. Thyroid gland, trachea, and esophagus demonstrate no significant findings. Lungs/Pleura: Mild bilateral ground-glass opacities and interlobular septal line  thickening is noted. There is mild bilateral dependent atelectasis. Mild emphysematous changes are noted. No pleural effusion or pneumothorax. Musculoskeletal: Left eighth through tenth rib fractures are favored to be chronic. There are chronic anterior wedge deformities of T5, T6 T10, T12 with up to 25-50% height loss anteriorly. Degenerative changes are seen in the spine. CT ABDOMEN PELVIS FINDINGS Hepatobiliary: No focal liver abnormality is seen. No gallstones, gallbladder wall thickening, or biliary dilatation. Pancreas: Unremarkable. No pancreatic ductal dilatation or surrounding inflammatory changes. Spleen: Normal in size without focal abnormality. Adrenals/Urinary Tract: Adrenal glands are unremarkable. Kidneys are mildly atrophic, without renal  calculi, focal lesion, or hydronephrosis. The urinary bladder is distended and demonstrates diffuse bladder wall thickening with trabeculation. Stomach/Bowel: Stomach is within normal limits. There is mild circumferential thickening of the rectum without significant surrounding inflammatory changes. Appendix appears normal. No evidence of bowel obstruction. Vascular/Lymphatic: Aortic atherosclerosis. No enlarged abdominal or pelvic lymph nodes. Reproductive: No suspicious finding. Other: No abdominal wall hernia or abnormality. No abdominopelvic ascites. Musculoskeletal: Degenerative changes are seen in the spine. IMPRESSION: 1. Mild bilateral ground-glass opacities and interlobular septal line thickening may reflect mild pulmonary edema. 2. Left eighth through tenth rib fractures are favored to be chronic. Correlation with point tenderness in this area could be helpful to confirm. 3. Mild circumferential thickening of the rectum without significant surrounding inflammatory changes may reflect proctitis. 4. Diffuse bladder wall thickening with trabeculation may reflect chronic bladder outlet obstruction. Aortic Atherosclerosis (ICD10-I70.0) and Emphysema  (ICD10-J43.9). Electronically Signed   By: Romona Curls M.D.   On: 12/08/2023 11:39   CT Head Wo Contrast Result Date: 12/08/2023 CLINICAL DATA:  78 year old male with history of trauma from a fall. Head and neck pain. EXAM: CT HEAD WITHOUT CONTRAST CT CERVICAL SPINE WITHOUT CONTRAST TECHNIQUE: Multidetector CT imaging of the head and cervical spine was performed following the standard protocol without intravenous contrast. Multiplanar CT image reconstructions of the cervical spine were also generated. RADIATION DOSE REDUCTION: This exam was performed according to the departmental dose-optimization program which includes automated exposure control, adjustment of the mA and/or kV according to patient size and/or use of iterative reconstruction technique. COMPARISON:  CT head and cervical spine 06/13/2023. FINDINGS: CT HEAD FINDINGS Brain: Severe cerebral and cerebellar atrophy. Patchy and confluent areas of decreased attenuation are noted throughout the deep and periventricular white matter of the cerebral hemispheres bilaterally, compatible with chronic microvascular ischemic disease. Ex vacuo dilatation of the ventricular system. Several more well-defined focal areas of low attenuation scattered throughout the basal ganglia bilaterally, and in the left side of the medulla, indicative of old lacunar infarcts. No evidence of acute infarction, hemorrhage, hydrocephalus, extra-axial collection or mass lesion/mass effect. Vascular: No hyperdense vessel or unexpected calcification. Skull: Normal. Negative for fracture or focal lesion. Sinuses/Orbits: No acute finding. Other: Soft tissue swelling in the right frontal scalp. CT CERVICAL SPINE FINDINGS Alignment: 2 mm of anterolisthesis of C4 upon C5. Alignment is otherwise anatomic. Skull base and vertebrae: No acute fracture. No primary bone lesion or focal pathologic process. Soft tissues and spinal canal: No prevertebral fluid or swelling. No visible canal hematoma.  Disc levels: Multilevel degenerative disc disease, most severe at C5-C6 and C6-C7. Moderate multilevel facet arthropathy. Upper chest: Mild paraseptal emphysema. Bilateral apical nodular pleuroparenchymal thickening and architectural distortion, most compatible with areas of chronic post infectious or inflammatory scarring. Other: Aortic atherosclerosis. IMPRESSION: 1. Right frontal scalp contusion without underlying calvarial fracture or acute intracranial abnormality. 2. No evidence of acute traumatic injury to the cervical spine. 3. 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 4. Multilevel degenerative disc disease and cervical spondylosis, as above. 5. Aortic atherosclerosis. Aortic Atherosclerosis (ICD10-I70.0) and Emphysema (ICD10-J43.9). Electronically Signed   By: Trudie Reed M.D.   On: 12/08/2023 08:52   CT Cervical Spine Wo Contrast Result Date: 12/08/2023 CLINICAL DATA:  78 year old male with history of trauma from a fall. Head and neck pain. EXAM: CT HEAD WITHOUT CONTRAST CT CERVICAL SPINE WITHOUT CONTRAST TECHNIQUE: Multidetector CT imaging of the  head and cervical spine was performed following the standard protocol without intravenous contrast. Multiplanar CT image reconstructions of the cervical spine were also generated. RADIATION DOSE REDUCTION: This exam was performed according to the departmental dose-optimization program which includes automated exposure control, adjustment of the mA and/or kV according to patient size and/or use of iterative reconstruction technique. COMPARISON:  CT head and cervical spine 06/13/2023. FINDINGS: CT HEAD FINDINGS Brain: Severe cerebral and cerebellar atrophy. Patchy and confluent areas of decreased attenuation are noted throughout the deep and periventricular white matter of the cerebral hemispheres bilaterally, compatible with  chronic microvascular ischemic disease. Ex vacuo dilatation of the ventricular system. Several more well-defined focal areas of low attenuation scattered throughout the basal ganglia bilaterally, and in the left side of the medulla, indicative of old lacunar infarcts. No evidence of acute infarction, hemorrhage, hydrocephalus, extra-axial collection or mass lesion/mass effect. Vascular: No hyperdense vessel or unexpected calcification. Skull: Normal. Negative for fracture or focal lesion. Sinuses/Orbits: No acute finding. Other: Soft tissue swelling in the right frontal scalp. CT CERVICAL SPINE FINDINGS Alignment: 2 mm of anterolisthesis of C4 upon C5. Alignment is otherwise anatomic. Skull base and vertebrae: No acute fracture. No primary bone lesion or focal pathologic process. Soft tissues and spinal canal: No prevertebral fluid or swelling. No visible canal hematoma. Disc levels: Multilevel degenerative disc disease, most severe at C5-C6 and C6-C7. Moderate multilevel facet arthropathy. Upper chest: Mild paraseptal emphysema. Bilateral apical nodular pleuroparenchymal thickening and architectural distortion, most compatible with areas of chronic post infectious or inflammatory scarring. Other: Aortic atherosclerosis. IMPRESSION: 1. Right frontal scalp contusion without underlying calvarial fracture or acute intracranial abnormality. 2. No evidence of acute traumatic injury to the cervical spine. 3. 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 4. Multilevel degenerative disc disease and cervical spondylosis, as above. 5. Aortic atherosclerosis. Aortic Atherosclerosis (ICD10-I70.0) and Emphysema (ICD10-J43.9). Electronically Signed   By: Trudie Reed M.D.   On: 12/08/2023 08:52   DG Pelvis Portable Result Date: 12/08/2023 CLINICAL DATA:  78 year old male history of trauma from a  fall. Pelvic pain. EXAM: PORTABLE PELVIS 1-2 VIEWS COMPARISON:  No priors. FINDINGS: Single view of the bony pelvis demonstrates no definite acute displaced fracture of the bony pelvic ring. Bilateral proximal femurs as visualized appear intact, and the femoral heads project over the acetabula bilaterally. There is joint space narrowing, subchondral sclerosis, subchondral cyst formation and osteophyte formation in the hip joints bilaterally indicative of osteoarthritis. IMPRESSION: 1. No acute radiographic abnormality of the bony pelvis. 2. Moderate osteoarthritis of the hips bilaterally. Electronically Signed   By: Trudie Reed M.D.   On: 12/08/2023 08:31   DG Chest Port 1 View Result Date: 12/08/2023 CLINICAL DATA:  Fall.  Patient on Eliquis. EXAM: PORTABLE CHEST 1 VIEW COMPARISON:  06/13/2023 FINDINGS: Patient is rotated to the right. Lungs are adequately inflated with mild prominence of the central perihilar markings suggesting of mild degree of vascular congestion. No focal airspace consolidation, effusion or pneumothorax. Cardiomediastinal silhouette is normal. Old lower lateral left rib fractures. No acute fracture visualized. Remainder of the exam is unchanged. IMPRESSION: 1.  No acute fracture. 2. Mild prominence of the central perihilar markings suggesting mild degree of vascular congestion. Electronically Signed   By: Elberta Fortis M.D.   On: 12/08/2023 08:30    Procedures Procedures    Medications Ordered in ED Medications  atorvastatin (LIPITOR) tablet 40 mg (  has no administration in time range)  mirtazapine (REMERON) tablet 7.5 mg (has no administration in time range)  polyethylene glycol (MIRALAX / GLYCOLAX) packet 17 g (17 g Oral Not Given 12/08/23 1458)  tamsulosin (FLOMAX) capsule 0.4 mg (has no administration in time range)  apixaban (ELIQUIS) tablet 5 mg (5 mg Oral Given 12/08/23 1458)  folic acid (FOLVITE) tablet 0.5 mg (0.5 mg Oral Given 12/08/23 1458)  acetaminophen  (TYLENOL) tablet 650 mg (has no administration in time range)    Or  acetaminophen (TYLENOL) suppository 650 mg (has no administration in time range)  amiodarone (NEXTERONE) 1.8 mg/mL load via infusion 150 mg (150 mg Intravenous Bolus from Bag 12/08/23 1457)    Followed by  amiodarone (NEXTERONE PREMIX) 360-4.14 MG/200ML-% (1.8 mg/mL) IV infusion (60 mg/hr Intravenous New Bag/Given 12/08/23 1457)    Followed by  amiodarone (NEXTERONE PREMIX) 360-4.14 MG/200ML-% (1.8 mg/mL) IV infusion (has no administration in time range)  sodium chloride 0.9 % bolus 500 mL (0 mLs Intravenous Stopped 12/08/23 1020)  sodium chloride 0.9 % bolus 500 mL (0 mLs Intravenous Stopped 12/08/23 1217)  iohexol (OMNIPAQUE) 350 MG/ML injection 75 mL (75 mLs Intravenous Contrast Given 12/08/23 1100)    ED Course/ Medical Decision Making/ A&P Clinical Course as of 12/08/23 1551  Sat Dec 08, 2023  0900 Patient initially had normal heart rate.  He has developed some RVR here.  MAP is okay although systolic somewhat soft.  He appears somewhat dry, will trial some IV fluids and likely was home metoprolol.  I suspect this is due to his underlying A-fib, he has benign abdominal exam, chest x-ray is overall unremarkable I have low suspicion for additional injury or bleeding at this time. [JD]  0934 Pressure still soft although MAP is okay.  Will add on CT of the chest and pelvis to rule out visceral injury. [JD]  1243 On reassessment, patient has converted to normal sinus rhythm.  He is slightly bradycardic but pressure has improved.  Systolic in the low 100s.  He is at his baseline.  His daughter who is a Engineer, civil (consulting) and significant other at bedside.  Patient's work appears overall reassuring.  Urinalysis without infection.  Only very mild anemia.  CMP is unremarkable.  CT head and cervical spine without acute findings or traumatic injuries.  CT chest on pelvis is notable for possible pulmonary edema otherwise no shortness of breath, appear dry  on exam and has no hypoxia.  All think needs diuresis especially given his initially low blood pressure.  He has possible rib fractures although is not tender on exam and appears to be chronic, I suspect they are old.  He also has some evidence of chronic bladder outlet obstruction although was able to urinate here without difficulty.  He has some thickening of his rectum with a lot of gas but no signs of obstruction, abscess, constipation.  He is pooping regularly.  No vomiting is tolerating p.o.  I do not think is any indication for antibiotics especially no leukocytosis or signs of sepsis.  I did discuss with family given his bradycardia we could try to admit him to the hospital for observation and monitoring.  Family would rather him go back to his facility.  He is currently at a memory care facility/SNF and they feel that they are able to take care of him. [JD]  1310 Unfortunately patient's facility is unable to monitor him closely over the weekend.  The will not have a provider to see him until  Tuesday and there is no nurse over the weekend to even review his vital signs. [JD]    Clinical Course User Index [JD] Laurence Spates, MD                                 Medical Decision Making Amount and/or Complexity of Data Reviewed Labs: ordered. Radiology: ordered.  Risk Prescription drug management. Decision regarding hospitalization.   Medical Decision Making:   ALOK MEZZANOTTE is a 78 y.o. male who presented to the ED today with fall.  He arrives hemodynamically stable.  He was intermittently in A-fib RVR with EMS, appears he did not get his home metoprolol this morning.  On exam he is a small hematoma to the head, but no other signs of trauma.  History somewhat limited given baseline mentation, however no other obvious signs of trauma and patient is not in pain or distress.  Per slightly dry we will give some IV fluids as well as obtain CT head and cervical spine.   Additional history  discussed with patient's family/caregivers.  Patient placed on continuous vitals and telemetry monitoring while in ED which was reviewed periodically.  Reviewed and confirmed nursing documentation for past medical history, family history, social history.   Reassessment and Plan:   See ED course above.  Patient initially was doing better and converted to sinus rhythm.  CT scan showed some possible proctitis but no acute traumatic injuries.  Chest was nontender I have low suspicion for acute rib fractures.  Blood pressure improved.  He was bradycardic after converting to sinus rhythm.  However he was asymptomatic.  I initially plan for discharge back to facility after discussion with family.  However he again went into A-fib RVR, and facility was unable to appropriately check his vital signs over the weekend.  I discussed the patient with the medicine service and he was ultimately admitted.   Patient's presentation is most consistent with acute complicated illness / injury requiring diagnostic workup.           Final Clinical Impression(s) / ED Diagnoses Final diagnoses:  Atrial fibrillation, unspecified type (HCC)  Fall, initial encounter    Rx / DC Orders ED Discharge Orders     None         Laurence Spates, MD 12/08/23 1551

## 2023-12-08 NOTE — Assessment & Plan Note (Addendum)
Hemoglobin 12.4 on arrival, however on chart review it looks like this is his baseline.  Asymptomatic.  Daughter-in-law states that he has a history of hematuria which was extensively work-up with Urology including a cystoscopy that was unremarkable. No recent Hematuria and U/A without hgb. - AM CBC - Continue folate daily

## 2023-12-08 NOTE — ED Triage Notes (Signed)
Pt bib ems from [facility] where pt was found beside his bed. Pt with 2 hematomas on top of his head. Takes eliquis. Did not fit in [facility]. Pt with hx afib, 78-150 with ems. All other vitals stable.

## 2023-12-08 NOTE — Progress Notes (Signed)
   12/08/23 1016  Spiritual Encounters  Type of Visit Initial  Care provided to: Family  Conversation partners present during encounter Nurse  Referral source Family  Reason for visit Routine spiritual support  OnCall Visit Yes    Chaplains Kennith Center and Susy Frizzle responded to family request for a pt update from interdisciplinary team while charting at ED station. Daughter-in-law stated pt was alone before she finally arrived. There was confusion as to which hospital pt was sent to - family notified at first that pt was sent to hospital in [place]. Family became concerned as to pt whereabouts, but was then contacted by pt care center that he was at [facility]. Daughter-in-law only family member present at this time, but pt's son and other family expected to arrive. Provided chair and empathetic listening for daughter-in-law, and informed her that we were available if needed. Pt unable to respond to chaplain's visit at this time.

## 2023-12-08 NOTE — Hospital Course (Addendum)
Adam Stephens is a 78 y.o.male with a history of *** who was admitted to the *** Teaching Service at [facility] for ***. {Blank single:19197::"His","Her"} hospital course is detailed below:  Dementia, HTN and Afib on Xarelto. HR 110-130. Went back to NS. Looked dry. Fell down. Scans normal. Titration of rate control b/c brady in the ED. Apparently facility cannot check vitals?   CREATININE 1.10 04/17/2023 1214  CREATININE 1.17 01/02/2023 1152   Other chronic conditions were medically managed with home medications and formulary alternatives as necessary (***)  PCP Follow-up Recommendations: Reassess need for potassium supplementation.

## 2023-12-08 NOTE — Consult Note (Signed)
CardiologyConsult:   Patient ID: Adam Stephens; MRN: [id number]; DOB: June 18, 1946   Admission date: 12/08/2023  Primary Care Provider: Pcp, No Primary Cardiologist: [facility]  Chief Complaint:  AF RVR  Patient Profile:   Adam Stephens is a 78 y.o. male with dementia who presents with AF RVR.  History of Present Illness:   Adam Stephens is a 78 year old individual with a history of dementia, atrial fibrillation, and peripheral arterial disease. He has been managed by the [facility] system. The patient's last evaluation was in 2024, during which he was dealing with Alzheimer's dementia and had experienced multiple falls, including one that resulted in a laceration over the left eye. At that time, he reported feeling okay with no chest pain or shortness of breath.  Despite being in rapid atrial fibrillation during a cardiology visit last year, the patient was asymptomatic. Due to minimal symptoms and dementia, he was placed on metoprolol 50 mg twice a day.   The patient's recent presentation was after a fall at a memory care facility. He has has intermittent AF.  When he has RVR he has hypotension.  Given this, cardiology was consulted.  I had discussed the risks and benefits for amiodarone with the prior team.   Allergies:    Allergies  Allergen Reactions   Aliskiren    Amlodipine    Hydrochlorothiazide     Social History:   Social History   Socioeconomic History   Marital status: Divorced    Spouse name: Not on file   Number of children: Not on file   Years of education: Not on file   Highest education level: Not on file  Occupational History   Not on file  Tobacco Use   Smoking status: Not on file   Smokeless tobacco: Not on file  Substance and Sexual Activity   Alcohol use: Not on file   Drug use: Not on file   Sexual activity: Not on file  Other Topics Concern   Not on file  Social History Narrative   Not on file   Social Drivers of  Health   Financial Resource Strain: Low Risk  (01/02/2023)   Received from [facility], [facility]   Overall Financial Resource Strain (CARDIA)    Difficulty of Paying Living Expenses: Not hard at all  Food Insecurity: No Food Insecurity (01/02/2023)   Received from [facility], [facility]   Hunger Vital Sign    Worried About Running Out of Food in the Last Year: Never true    Ran Out of Food in the Last Year: Never true  Transportation Needs: No Transportation Needs (03/31/2023)   Received from [facility], [facility]   PRAPARE - Transportation    Lack of Transportation (Medical): No    Lack of Transportation (Non-Medical): No  Physical Activity: Unknown (07/11/2022)   Received from [facility], [facility]   Exercise Vital Sign    Days of Exercise per Week: 0 days    Minutes of Exercise per Session: Not on file  Stress: No Stress Concern Present (07/11/2022)   Received from [facility], [facility] - Occupational Stress Questionnaire    Feeling of Stress : Only a little  Social Connections: Unknown (07/13/2023)   Received from [facility]   Social Network    Social Network: Not on file  Intimate Partner Violence: Not At Risk (04/17/2023)   Received from [facility]   HITS  Over the last 12 months how often did your partner physically hurt you?: Never    Over the last 12 months how often did your partner insult you or talk down to you?: Never    Over the last 12 months how often did your partner threaten you with physical harm?: Never    Over the last 12 months how often did your partner scream or curse at you?: Never    Family History:   Alzheimer's disease both parents.  ROS:  Please see the history of present illness.   Physical Exam/Data:   Vitals:   12/08/23 1400 12/08/23 1425 12/08/23 1500 12/08/23 1515  BP: (!) 107/92 98/87 109/81 127/75  Pulse: (!) 104 (!) 151 91 70  Resp: 16 15 12 12   Temp:       SpO2: 100% 99% 100% 99%  Weight:      Height:       No intake or output data in the 24 hours ending 12/08/23 1541 Filed Weights   12/08/23 1025  Weight: 81.6 kg   Body mass index is 25.83 kg/m.   Gen: Chronically ill   Neck: No JVD Ears: Bilateral Adam Stephens Sign Cardiac: No Rubs or Gallops, systolic murmur, regularly bradycardia, +2 radial pulses Respiratory: Coarse breath sounds GI: Soft, nontender, non-distended  MS: No  edema;  moves all extremities Integument: Skin feels warm Neuro:  At time of evaluation, alert and oriented to person/place/time; to some degree situation Psych: Normal affect, patient feels ok, difficult speech  Tele:  Sinus bradycardia> AF RVR-> Sinus bradycardia  RADIOLOGY  CT scan: peripheral arterial disease, aortic atherosclerosis, mitral annular calcification, aortic valve calcifications, coronary artery calcifications, calcifications in left main LAD and left circumflex artery (12/08/2023)   Laboratory Data:  Chemistry Recent Labs  Lab 12/08/23 0900  NA 136  K 4.3  CL 103  CO2 22  GLUCOSE 81  BUN 16  CREATININE 1.15  CALCIUM 9.7  GFRNONAA >60  ANIONGAP 11    Recent Labs  Lab 12/08/23 0900  PROT 6.6  ALBUMIN 3.5  AST 20  ALT 10  ALKPHOS 94  BILITOT 0.6   Hematology Recent Labs  Lab 12/08/23 0900  WBC 10.2  RBC 3.85*  HGB 12.4*  HCT 37.9*  MCV 98.4  MCH 32.2  MCHC 32.7  RDW 14.0  PLT 259     Assessment and Plan:   Persistent Atrial Fibrillation Persistent atrial fibrillation, status post chemical cardioversion. Asymptomatic but experienced rapid ventricular response with hypotension. Managed with IV amiodarone, transitioning to PO amiodarone tomorrow (200 mg PO daily). Continued anticoagulation with Eliquis. Discussed risks of amiodarone (hypotension, bradycardia) and benefits (rate control, prevention of recurrence). Alternatives include other antiarrhythmics or rate control agents. Patient and daughter consented to  the plan. - Continue IV amiodarone overnight - Transition to amiodarone 200 mg PO daily tomorrow - hold BB due to baseline bradycardia - Continue Eliquis  Alzheimer's Dementia Alzheimer's dementia with multiple falls and a laceration over the left eye. Difficulty in communication noted. Conservative goals of care discussed with primary team and daughter. No echocardiogram planned due to minimal symptoms and advanced dementia. - Conservative goals of care discussed with primary team and daughter - No echocardiogram planned  Peripheral Arterial Disease Aortic Atherosclerosis Mitral Annular Calcification Aortic Valve Calcifications Coronary Artery Calcifications - Coronary artery calcifications in the left main LAD and left circumflex artery noted on CT scan. - conservative plan in the setting of dementia, continuing statin, no diagnostics planned  Follow-up -  Follow up with [facility], Kathryne Sharper.  For questions or updates, please contact [facility] Please consult [email or web address] for contact info under Cardiology/STEMI.   Riley Lam, MD FASE [facility] Cardiologist [facility]  [address] [place], [address] [place], [place] [address] [phone]  3:41 PM

## 2023-12-08 NOTE — Assessment & Plan Note (Addendum)
Unwitnessed fall at memory care facility.  Chest x-ray, pelvis x-ray, CT C-spine negative for acute injury. CT head showing right frontal scalp contusion, otherwise unremarkable for acute abnormality.  CT chest abdomen pelvis with chronic rib fractures, mild circumferential thickening of rectum (no bowel symptoms reported), and chronic bladder outlet obstruction. U/A unremarkable.  - Fall precautions - Hold aspirin

## 2023-12-08 NOTE — ED Notes (Signed)
CCMD notified of pt.

## 2023-12-09 ENCOUNTER — Encounter (HOSPITAL_COMMUNITY): Payer: Self-pay | Admitting: Family Medicine

## 2023-12-09 DIAGNOSIS — I4891 Unspecified atrial fibrillation: Secondary | ICD-10-CM | POA: Diagnosis not present

## 2023-12-09 LAB — CBC
HCT: 34.2 % — ABNORMAL LOW (ref 39.0–52.0)
Hemoglobin: 11.2 g/dL — ABNORMAL LOW (ref 13.0–17.0)
MCH: 31.6 pg (ref 26.0–34.0)
MCHC: 32.7 g/dL (ref 30.0–36.0)
MCV: 96.6 fL (ref 80.0–100.0)
Platelets: 246 10*3/uL (ref 150–400)
RBC: 3.54 MIL/uL — ABNORMAL LOW (ref 4.22–5.81)
RDW: 14.3 % (ref 11.5–15.5)
WBC: 10.4 10*3/uL (ref 4.0–10.5)
nRBC: 0 % (ref 0.0–0.2)

## 2023-12-09 LAB — BASIC METABOLIC PANEL
Anion gap: 11 (ref 5–15)
BUN: 20 mg/dL (ref 8–23)
CO2: 22 mmol/L (ref 22–32)
Calcium: 9.4 mg/dL (ref 8.9–10.3)
Chloride: 106 mmol/L (ref 98–111)
Creatinine, Ser: 1.37 mg/dL — ABNORMAL HIGH (ref 0.61–1.24)
GFR, Estimated: 52 mL/min — ABNORMAL LOW (ref >60)
Glucose, Bld: 112 mg/dL — ABNORMAL HIGH (ref 70–99)
Potassium: 4 mmol/L (ref 3.5–5.1)
Sodium: 139 mmol/L (ref 135–145)

## 2023-12-09 LAB — MRSA NEXT GEN BY PCR, NASAL: MRSA by PCR Next Gen: NOT DETECTED

## 2023-12-09 LAB — MAGNESIUM: Magnesium: 2.1 mg/dL (ref 1.7–2.4)

## 2023-12-09 MED ORDER — ORAL CARE MOUTH RINSE: 15.0000 mL | OROMUCOSAL | Status: DC | PRN

## 2023-12-09 MED ORDER — AMIODARONE HCL 200 MG PO TABS
200.0000 mg | ORAL_TABLET | Freq: Every day | ORAL | Status: DC
Start: 2023-12-09 — End: 2023-12-09
  Administered 2023-12-09: 200 mg via ORAL
  Filled 2023-12-09: qty 1

## 2023-12-09 MED ORDER — ORAL CARE MOUTH RINSE
15.0000 mL | OROMUCOSAL | Status: DC
  Administered 2023-12-09 (×3): 15 mL via OROMUCOSAL

## 2023-12-09 MED ORDER — AMIODARONE HCL 200 MG PO TABS: 200.0000 mg | ORAL_TABLET | Freq: Every day | ORAL | Status: DC

## 2023-12-09 NOTE — Assessment & Plan Note (Signed)
Hemoglobin 12.4 on arrival, hx of hematuria but not an issue here. - CBC - Continue folate daily

## 2023-12-09 NOTE — Progress Notes (Signed)
Patient turned to yellow MEWs due to bradycardia. This is not new and had been going on intermittently in the emergency room prior to admit. Yellow MEWS implemented. Charge nurse of unit notified at [phone].     12/09/23 0342  Assess: MEWS Score  Temp 98.5 F (36.9 C)  BP 127/74  MAP (mmHg) 89  Pulse Rate (!) 47  ECG Heart Rate (!) 47  Resp 13  SpO2 98 %  O2 Device Room Air  Assess: MEWS Score  MEWS Temp 0  MEWS Systolic 0  MEWS Pulse 1  MEWS RR 1  MEWS LOC 0  MEWS Score 2  MEWS Score Color Yellow  Assess: if the MEWS score is Yellow or Red  Were vital signs accurate and taken at a resting state? Yes  Does the patient meet 2 or more of the SIRS criteria? No  MEWS guidelines implemented  Yes, yellow  Treat  MEWS Interventions Considered administering scheduled or prn medications/treatments as ordered  Take Vital Signs  Increase Vital Sign Frequency  Yellow: Q2hr x1, continue Q4hrs until patient remains green for 12hrs  Escalate  MEWS: Escalate Yellow: Discuss with charge nurse and consider notifying provider and/or RRT  Notify: Charge Nurse/RN  Name of Charge Nurse/RN Notified Ann, RN  Assess: SIRS CRITERIA  SIRS Temperature  0  SIRS Respirations  0  SIRS Pulse 0  SIRS WBC 0  SIRS Score Sum  0

## 2023-12-09 NOTE — Discharge Instructions (Signed)
Dear Adam Stephens,   Thank you so much for allowing [place] to be part of your care!  You were admitted to [facility] for fall and afib with RVR   POST-HOSPITAL & CARE INSTRUCTIONS Take amiodarone 200 mg daily, stop metoprolol Please let PCP/Specialists know of any changes that were made.  Please see medications section of this packet for any medication changes.   DOCTOR'S APPOINTMENT & FOLLOW UP CARE INSTRUCTIONS  No future appointments.  RETURN PRECAUTIONS:   Take care and be well!  Family Medicine Teaching Service  [place]  [facility]  [address] [place], [place] [address] [phone]

## 2023-12-09 NOTE — Plan of Care (Signed)
  Problem: Clinical Measurements: Goal: Ability to maintain clinical measurements within normal limits will improve Outcome: Progressing Goal: Cardiovascular complication will be avoided Outcome: Progressing   Problem: Elimination: Goal: Will not experience complications related to urinary retention Outcome: Progressing   Problem: Skin Integrity: Goal: Risk for impaired skin integrity will decrease Outcome: Progressing

## 2023-12-09 NOTE — Progress Notes (Addendum)
     Daily Progress Note Intern Pager: [phone]  Patient name: Adam Stephens Medical record number: [id number] Date of birth: [birth year] Age: 78 y.o. Gender: male  Primary Care Provider: Pcp, No Consultants: Cardiology Code Status: DNR-Limited  Pt Overview and Major Events to Date:  1/18 admitted on amio gtt  Assessment and Plan:  Adam Stephens PMH of Dementia, paroxysmal A-fib, hypertension, history of CVA is a 78 y.o. male presenting with atrial fibrillation with RVR after being brought to the ED for a fall at his memory care facility.  Initially on amiodarone drip which was switched to p.o. amiodarone this morning.  Has been holding metoprolol due to low heart rates. Assessment & Plan Atrial fibrillation with RVR (HCC) RVR resolved, now off of amio drip and on PO amiodarone. - Cardiology following, appreciate recommendations - amiodarone gtt switched to PO amiodarone this AM - Continue home Eliquis 5mg  BID, consider goals of care conversation surrounding this - hold home metoprolol - CBC, BMP, Mag Fall Unwitnessed fall at memory care facility.  Imaging negative. Likely in setting of RVR/advanced dementia. - Fall precautions - Hold aspirin Anemia Hemoglobin 12.4 on arrival, hx of hematuria but not an issue here. - CBC - Continue folate daily  Chronic and Stable Issues: HTN - stable HLD - continue home atorvastatin 40mg  QD Alzheimer's disease - continue Remeron 7.5mg  at bedtime, delirium precautions CVA - holding aspirin 81mg  daily Urinary retention - not an issue currently, continue home flomax 0.4mg  at bedtime CKD3a - stable Anemia - continue folate  FEN/GI: DYS 1 PPx: eliquis Dispo: Could go back to memory facility today   Subjective:  Patient is awake and alert however unable to answer questions verbally.  Wife at bedside who agrees could go back to memory facility.  Does mention that there may not be a physician until Tuesday in the facility but overall  felt okay with discharge  Objective: Temp:  [98.2 F (36.8 C)-99.3 F (37.4 C)] 98.5 F (36.9 C) (01/19 0342) Pulse Rate:  [46-151] 56 (01/19 0540) Resp:  [9-25] 10 (01/19 0540) BP: (82-127)/(53-92) 127/76 (01/19 0540) SpO2:  [97 %-100 %] 97 % (01/19 0540) Weight:  [61.9 kg-81.6 kg] 61.9 kg (01/18 2255) Physical Exam: General: NAD, awake, alert Cardiovascular: Mildly bradycardic to the 50s to 60s, regular rhythm, harsh systolic murmur Respiratory: Clear to auscultation in anterior lung fields, normal work of breathing on room air Abdomen: Soft, nontender to palpation, nondistended Extremities: No lower extremity edema, nontender to palpation, well-perfused  Laboratory: Most recent CBC Lab Results  Component Value Date   WBC 10.2 12/08/2023   HGB 12.4 (L) 12/08/2023   HCT 37.9 (L) 12/08/2023   MCV 98.4 12/08/2023   PLT 259 12/08/2023   Most recent BMP    Latest Ref Rng & Units 12/08/2023    9:00 AM  BMP  Glucose 70 - 99 mg/dL 81   BUN 8 - 23 mg/dL 16   Creatinine 2.84 - 1.24 mg/dL 1.32   Sodium 440 - 102 mmol/L 136   Potassium 3.5 - 5.1 mmol/L 4.3   Chloride 98 - 111 mmol/L 103   CO2 22 - 32 mmol/L 22   Calcium 8.9 - 10.3 mg/dL 9.7     Levin Erp, MD 12/09/2023, 8:05 AM  PGY-3, [facility] FPTS Intern pager: [phone], text pages welcome Secure chat group [facility] Teaching Service

## 2023-12-09 NOTE — Assessment & Plan Note (Signed)
Unwitnessed fall at memory care facility.  Imaging negative. Likely in setting of RVR/advanced dementia. - Fall precautions - Hold aspirin

## 2023-12-09 NOTE — Care Management CC44 (Signed)
Condition Code 44 Documentation Completed  Patient Details  Name: Adam Stephens MRN: [id number] Date of Birth: 1946/11/19   Condition Code 44 given:  Yes Patient signature on Condition Code 44 notice:  Yes Documentation of 2 MD's agreement:  Yes Code 44 added to claim:  Yes    Lawerance Sabal, RN 12/09/2023, 12:38 PM

## 2023-12-09 NOTE — Plan of Care (Signed)
  Problem: Education: Goal: Knowledge of General Education information will improve Description Including pain rating scale, medication(s)/side effects and non-pharmacologic comfort measures Outcome: Progressing   

## 2023-12-09 NOTE — Care Management Obs Status (Signed)
MEDICARE OBSERVATION STATUS NOTIFICATION   Patient Details  Name: NIRAJ ORTLIP MRN: [id number] Date of Birth: 08/22/1946   Medicare Observation Status Notification Given:  Yes    Lawerance Sabal, RN 12/09/2023, 12:38 PM

## 2023-12-09 NOTE — Progress Notes (Signed)
Progress Note  Patient Name: Adam Stephens Date of Encounter: 12/09/2023  Primary Cardiologist: None   Subjective   Pt appears comfortable, resting peacefully  Inpatient Medications    Scheduled Meds:  amiodarone  200 mg Oral Daily   apixaban  5 mg Oral BID   atorvastatin  40 mg Oral QHS   folic acid  500 mcg Oral Daily   mirtazapine  7.5 mg Oral QHS   mouth rinse  15 mL Mouth Rinse 4 times per day   polyethylene glycol  17 g Oral Daily   tamsulosin  0.4 mg Oral QHS   Continuous Infusions:  PRN Meds: acetaminophen **OR** acetaminophen, mouth rinse   Vital Signs    Vitals:   12/08/23 1945 12/08/23 2255 12/09/23 0342 12/09/23 0540  BP: (!) 104/54 121/71 127/74 127/76  Pulse: 63 60 (!) 47 (!) 56  Resp: 13 16 13 10   Temp: 98.2 F (36.8 C) 98.7 F (37.1 C) 98.5 F (36.9 C)   TempSrc: Oral Oral Oral   SpO2: 100% 97% 98% 97%  Weight:  61.9 kg    Height:  5\' 10"  (1.778 m)      Intake/Output Summary (Last 24 hours) at 12/09/2023 0800 Last data filed at 12/09/2023 0547 Gross per 24 hour  Intake 1000 ml  Output 201 ml  Net 799 ml   Filed Weights   12/08/23 1025 12/08/23 2255  Weight: 81.6 kg 61.9 kg    Telemetry    Sinus rhythm and sinus bradycardia - Personally Reviewed  ECG    12/09/2023 - Personally Reviewed  Physical Exam   GEN: No acute distress.   Neck: No JVD Cardiac: RRR Respiratory: breathing easily  Labs    Chemistry Recent Labs  Lab 12/08/23 0900  NA 136  K 4.3  CL 103  CO2 22  GLUCOSE 81  BUN 16  CREATININE 1.15  CALCIUM 9.7  PROT 6.6  ALBUMIN 3.5  AST 20  ALT 10  ALKPHOS 94  BILITOT 0.6  GFRNONAA >60  ANIONGAP 11     Hematology Recent Labs  Lab 12/08/23 0900  WBC 10.2  RBC 3.85*  HGB 12.4*  HCT 37.9*  MCV 98.4  MCH 32.2  MCHC 32.7  RDW 14.0  PLT 259    Cardiac EnzymesNo results for input(s): "TROPONINI" in the last 168 hours. No results for input(s): "TROPIPOC" in the last 168 hours.   BNPNo results  for input(s): "BNP", "PROBNP" in the last 168 hours.   DDimer No results for input(s): "DDIMER" in the last 168 hours.   Summary of Pertinent studies    TTE: NA  Cardiac cath: NA  Imaging: CT head/chest/pelvis reviewed in epic  Labs: Reviewed in Epic  Patient Profile     78 y.o. male with a history of dementia, atrial fibrillation, and peripheral arterial disease   Assessment & Plan    Persistent Atrial fibrillation with RVR; tachy-brady Has not been treated aggressively, rate control approach due to dementia and paucity of symptoms Often hypotensive in AF Amiodarone started to better manage AF with rhythm control In sinus rhythm now Switch from IV to PO amiodarone today. Hold metoprolol due to sinus bradycardia  Alzheimer's dementia Conservative goals of care per Dr. Debby Bud discussion with family  Recurrent falls Sometimes resulting in facial/head trauma Given severity and frequency of falls and current trauma, he is not a good candidate for anticoagulation    For questions or updates, please contact [facility] Please consult [email or web address] for  contact info under Cardiology/STEMI.      Signed, Maurice Small, MD 12/09/2023, 8:00 AM

## 2023-12-09 NOTE — TOC Progression Note (Signed)
Transition of Care [facility]) - Progression Note    Patient Details  Name: Adam Stephens MRN: [id number] Date of Birth: 1946-05-21  Transition of Care [facility]) CM/SW Contact  Donnalee Curry, LCSWA Phone Number: 12/09/2023, 10:48 AM  Clinical Narrative:      SW spoke with [place] ([facility] [phone]) reports pt can return. Call Report: [phone]        Expected Discharge Plan and Services                                               Social Determinants of Health (SDOH) Interventions SDOH Screenings   Food Insecurity: No Food Insecurity (01/02/2023)   Received from [facility], [facility]  Transportation Needs: No Transportation Needs (03/31/2023)   Received from [facility], [facility]  Utilities: Not At Risk (01/02/2023)   Received from [facility], [facility]  Financial Resource Strain: Low Risk  (01/02/2023)   Received from [facility], [facility]  Physical Activity: Unknown (07/11/2022)   Received from [facility], [facility]  Social Connections: Unknown (07/13/2023)   Received from [facility]  Stress: No Stress Concern Present (07/11/2022)   Received from [facility], [facility]  Tobacco Use: Medium Risk (09/06/2023)   Received from [facility]    Readmission Risk Interventions     No data to display

## 2023-12-09 NOTE — TOC Transition Note (Signed)
Transition of Care [facility]) - Discharge Note   Patient Details  Name: Adam Stephens MRN: [id number] Date of Birth: Jun 05, 1946  Transition of Care [facility]) CM/SW Contact:  Donnalee Curry, LCSWA Phone Number: 12/09/2023, 1:09 PM   Clinical Narrative:     Pt returning to [facility] via [place].   Family at bedside.     Final next level of care: Memory Care Barriers to Discharge: Barriers Resolved   Patient Goals and CMS Choice            Discharge Placement                Patient to be transferred to facility by: [facility]AR Name of family member notified: Family at bedside Patient and family notified of of transfer: 12/09/23  Discharge Plan and Services Additional resources added to the After Visit Summary for                                       Social Drivers of Health (SDOH) Interventions SDOH Screenings   Food Insecurity: No Food Insecurity (01/02/2023)   Received from [facility], [facility]  Transportation Needs: No Transportation Needs (03/31/2023)   Received from [facility], [facility]  Utilities: Not At Risk (01/02/2023)   Received from [facility], [facility]  Financial Resource Strain: Low Risk  (01/02/2023)   Received from [facility], [facility]  Physical Activity: Unknown (07/11/2022)   Received from [facility], [facility]  Social Connections: Unknown (07/13/2023)   Received from [facility]  Stress: No Stress Concern Present (07/11/2022)   Received from [facility], [facility]  Tobacco Use: Medium Risk (09/06/2023)   Received from [facility]     Readmission Risk Interventions     No data to display

## 2023-12-09 NOTE — Assessment & Plan Note (Addendum)
RVR resolved, now off of amio drip and on PO amiodarone. - Cardiology following, appreciate recommendations - amiodarone gtt switched to PO amiodarone this AM - Continue home Eliquis 5mg  BID, consider goals of care conversation surrounding this - hold home metoprolol - CBC, BMP, Mag

## 2023-12-09 NOTE — Discharge Summary (Signed)
Family Medicine Teaching [facility] Discharge Summary  Patient name: Adam Stephens Medical record number: [id number] Date of birth: August 11, 1946 Age: 78 y.o. Gender: male Date of Admission: 12/08/2023  Date of Discharge: 12/09/2023 Admitting Physician: Doreene Eland, MD  Primary Care Provider: Pcp, No Consultants: Cardiology  Indication for Hospitalization: Afib RVR  Brief Hospital Course:  Adam Stephens is a 89 y.o.male with a history of Dementia, paroxysmal A-fib, hypertension, history of CVA who was admitted to the [facility] at [facility] for fall and Afib RVR. His hospital course is detailed below:  Atrial fibrillation with RVR  On Metoprolol at home for rate control. History of RVR, most recently May 2024. Placed on Amiodarone drip in ED as metoprolol dipped HR to 30s-40s and low pressures. Cardiology was consulted who recommended switching IV amiodarone to p.o. 200 mg daily starting 1/19.  Discussed with wife about poor anticoagulation candidate given recent falls.  Wife states there have been a decrease in falls due to patient's mobility and would like to continue Eliquis for now but will consider discontinuing this in the future if frequency of falls increases.  We discontinued aspirin at discharge which was discussed with wife.  Fall Unwitnessed fall at memory care facility.  Chest x-ray, pelvis x-ray, CT C-spine negative for acute injury. CT head showing right frontal scalp contusion, otherwise unremarkable for acute abnormality.  CT chest abdomen pelvis with chronic rib fractures, mild circumferential thickening of rectum (no bowel symptoms reported), and chronic bladder outlet obstruction. Fall likely in setting of advanced dementia.  Other chronic conditions were medically managed with home medications and formulary alternatives as necessary  Issues for Follow Up:  1.Continue conversations about continued anticoagulation with Eliquis outpatient given  frequency of falls (discontinued ASA 81 after conversation with wife) 2. Potassium supplement stopped at discharge, reassess need for this  Disposition: Memory care unit  Discharge Condition: Stable  Discharge Exam:  General: NAD, awake, alert Cardiovascular: Mildly bradycardic to the 50s to 60s, regular rhythm, harsh systolic murmur Respiratory: Clear to auscultation in anterior lung fields, normal work of breathing on room air Abdomen: Soft, nontender to palpation, nondistended Extremities: No lower extremity edema, nontender to palpation, well-perfused  Significant Procedures: None  Significant Labs and Imaging:  Recent Labs  Lab 12/08/23 0900 12/09/23 0858  WBC 10.2 10.4  HGB 12.4* 11.2*  HCT 37.9* 34.2*  PLT 259 246   Recent Labs  Lab 12/08/23 0900 12/09/23 0858  NA 136 139  K 4.3 4.0  CL 103 106  CO2 22 22  GLUCOSE 81 112*  BUN 16 20  CREATININE 1.15 1.37*  CALCIUM 9.7 9.4  MG  --  2.1  ALKPHOS 94  --   AST 20  --   ALT 10  --   ALBUMIN 3.5  --     Results/Tests Pending at Time of Discharge: None  Discharge Medications:  Allergies as of 12/09/2023       Reactions   Aliskiren    Amlodipine    Hydrochlorothiazide         Medication List     STOP taking these medications    Aspirin Low Dose 81 MG chewable tablet Generic drug: aspirin   metoprolol tartrate 25 MG tablet Commonly known as: LOPRESSOR   potassium chloride SA 20 MEQ tablet Commonly known as: KLOR-CON M       TAKE these medications    amiodarone 200 MG tablet Commonly known as: PACERONE Take 1 tablet (  200 mg total) by mouth daily. Start taking on: December 10, 2023   apixaban 5 MG Tabs tablet Commonly known as: ELIQUIS Take 5 mg by mouth 2 (two) times daily.   atorvastatin 40 MG tablet Commonly known as: LIPITOR Take 40 mg by mouth at bedtime.   folic acid 800 MCG tablet Commonly known as: FOLVITE Take 400 mcg by mouth daily.   GoodSense ClearLax 17 GM/SCOOP  powder Generic drug: polyethylene glycol powder Take 17 g by mouth daily.   Melatonin 10 MG Tabs Take 10 mg by mouth at bedtime.   mirtazapine 7.5 MG tablet Commonly known as: REMERON Take 7.5 mg by mouth at bedtime.   tamsulosin 0.4 MG Caps capsule Commonly known as: FLOMAX Take 0.4 mg by mouth at bedtime.        Discharge Instructions: Please refer to Patient Instructions section of EMR for full details.  Patient was counseled important signs and symptoms that should prompt return to medical care, changes in medications, dietary instructions, activity restrictions, and follow up appointments.   Follow-Up Appointments:   Levin Erp, MD 12/09/2023, 11:57 AM PGY-3, [facility]

## 2023-12-22 ENCOUNTER — Encounter (HOSPITAL_COMMUNITY): Payer: Self-pay

## 2023-12-22 ENCOUNTER — Inpatient Hospital Stay (HOSPITAL_COMMUNITY): Payer: Medicare HMO

## 2023-12-22 ENCOUNTER — Inpatient Hospital Stay (HOSPITAL_COMMUNITY)
Admission: EM | Admit: 2023-12-22 | Discharge: 2023-12-24 | DRG: 067 | Disposition: A | Discharge status: Skilled Nursing Facility | Payer: Medicare HMO | Source: Skilled Nursing Facility | Attending: Family Medicine | Admitting: Family Medicine

## 2023-12-22 ENCOUNTER — Emergency Department (HOSPITAL_COMMUNITY): Payer: Medicare HMO

## 2023-12-22 ENCOUNTER — Other Ambulatory Visit: Payer: Self-pay

## 2023-12-22 DIAGNOSIS — I1310 Hypertensive heart and chronic kidney disease without heart failure, with stage 1 through stage 4 chronic kidney disease, or unspecified chronic kidney disease: Secondary | ICD-10-CM | POA: Diagnosis not present

## 2023-12-22 DIAGNOSIS — Z7901 Long term (current) use of anticoagulants: Secondary | ICD-10-CM | POA: Diagnosis not present

## 2023-12-22 DIAGNOSIS — Z66 Do not resuscitate: Secondary | ICD-10-CM | POA: Diagnosis present

## 2023-12-22 DIAGNOSIS — I480 Paroxysmal atrial fibrillation: Secondary | ICD-10-CM | POA: Diagnosis not present

## 2023-12-22 DIAGNOSIS — G459 Transient cerebral ischemic attack, unspecified: Secondary | ICD-10-CM | POA: Diagnosis not present

## 2023-12-22 DIAGNOSIS — Z79899 Other long term (current) drug therapy: Secondary | ICD-10-CM

## 2023-12-22 DIAGNOSIS — E7800 Pure hypercholesterolemia, unspecified: Secondary | ICD-10-CM | POA: Diagnosis present

## 2023-12-22 DIAGNOSIS — I6521 Occlusion and stenosis of right carotid artery: Secondary | ICD-10-CM | POA: Diagnosis present

## 2023-12-22 DIAGNOSIS — I6603 Occlusion and stenosis of bilateral middle cerebral arteries: Principal | ICD-10-CM | POA: Diagnosis present

## 2023-12-22 DIAGNOSIS — R319 Hematuria, unspecified: Secondary | ICD-10-CM | POA: Diagnosis present

## 2023-12-22 DIAGNOSIS — I69320 Aphasia following cerebral infarction: Secondary | ICD-10-CM | POA: Diagnosis not present

## 2023-12-22 DIAGNOSIS — R569 Unspecified convulsions: Secondary | ICD-10-CM

## 2023-12-22 DIAGNOSIS — I350 Nonrheumatic aortic (valve) stenosis: Secondary | ICD-10-CM | POA: Diagnosis present

## 2023-12-22 DIAGNOSIS — Z7401 Bed confinement status: Secondary | ICD-10-CM

## 2023-12-22 DIAGNOSIS — I951 Orthostatic hypotension: Secondary | ICD-10-CM | POA: Diagnosis not present

## 2023-12-22 DIAGNOSIS — D631 Anemia in chronic kidney disease: Secondary | ICD-10-CM | POA: Diagnosis present

## 2023-12-22 DIAGNOSIS — E860 Dehydration: Secondary | ICD-10-CM | POA: Diagnosis present

## 2023-12-22 DIAGNOSIS — Z87891 Personal history of nicotine dependence: Secondary | ICD-10-CM

## 2023-12-22 DIAGNOSIS — N189 Chronic kidney disease, unspecified: Secondary | ICD-10-CM | POA: Insufficient documentation

## 2023-12-22 DIAGNOSIS — G9341 Metabolic encephalopathy: Secondary | ICD-10-CM | POA: Diagnosis not present

## 2023-12-22 DIAGNOSIS — R1310 Dysphagia, unspecified: Secondary | ICD-10-CM | POA: Diagnosis present

## 2023-12-22 DIAGNOSIS — Z888 Allergy status to other drugs, medicaments and biological substances status: Secondary | ICD-10-CM

## 2023-12-22 DIAGNOSIS — I4891 Unspecified atrial fibrillation: Secondary | ICD-10-CM

## 2023-12-22 DIAGNOSIS — F02C0 Dementia in other diseases classified elsewhere, severe, without behavioral disturbance, psychotic disturbance, mood disturbance, and anxiety: Secondary | ICD-10-CM | POA: Diagnosis present

## 2023-12-22 DIAGNOSIS — N390 Urinary tract infection, site not specified: Secondary | ICD-10-CM | POA: Insufficient documentation

## 2023-12-22 DIAGNOSIS — I69354 Hemiplegia and hemiparesis following cerebral infarction affecting left non-dominant side: Secondary | ICD-10-CM

## 2023-12-22 DIAGNOSIS — N1831 Chronic kidney disease, stage 3a: Secondary | ICD-10-CM | POA: Diagnosis present

## 2023-12-22 DIAGNOSIS — N179 Acute kidney failure, unspecified: Secondary | ICD-10-CM | POA: Diagnosis not present

## 2023-12-22 DIAGNOSIS — R339 Retention of urine, unspecified: Secondary | ICD-10-CM | POA: Diagnosis not present

## 2023-12-22 DIAGNOSIS — G8194 Hemiplegia, unspecified affecting left nondominant side: Secondary | ICD-10-CM | POA: Diagnosis present

## 2023-12-22 DIAGNOSIS — G309 Alzheimer's disease, unspecified: Secondary | ICD-10-CM | POA: Diagnosis present

## 2023-12-22 LAB — ECHOCARDIOGRAM COMPLETE
AR max vel: 1.54 cm2
AV Area VTI: 1.57 cm2
AV Area mean vel: 1.46 cm2
AV Mean grad: 10 mm[Hg]
AV Peak grad: 18.8 mm[Hg]
Ao pk vel: 2.17 m/s
Area-P 1/2: 3.6 cm2
Height: 70 in
S' Lateral: 3.2 cm
Weight: 2183.44 [oz_av]

## 2023-12-22 LAB — CBC
HCT: 38 % — ABNORMAL LOW (ref 39.0–52.0)
Hemoglobin: 12.5 g/dL — ABNORMAL LOW (ref 13.0–17.0)
MCH: 32.5 pg (ref 26.0–34.0)
MCHC: 32.9 g/dL (ref 30.0–36.0)
MCV: 98.7 fL (ref 80.0–100.0)
Platelets: 296 10*3/uL (ref 150–400)
RBC: 3.85 MIL/uL — ABNORMAL LOW (ref 4.22–5.81)
RDW: 14.1 % (ref 11.5–15.5)
WBC: 7.8 10*3/uL (ref 4.0–10.5)
nRBC: 0 % (ref 0.0–0.2)

## 2023-12-22 LAB — COMPREHENSIVE METABOLIC PANEL
ALT: 8 U/L (ref 0–44)
AST: 24 U/L (ref 15–41)
Albumin: 3.7 g/dL (ref 3.5–5.0)
Alkaline Phosphatase: 108 U/L (ref 38–126)
Anion gap: 13 (ref 5–15)
BUN: 17 mg/dL (ref 8–23)
CO2: 24 mmol/L (ref 22–32)
Calcium: 10.1 mg/dL (ref 8.9–10.3)
Chloride: 100 mmol/L (ref 98–111)
Creatinine, Ser: 1.55 mg/dL — ABNORMAL HIGH (ref 0.61–1.24)
GFR, Estimated: 45 mL/min — ABNORMAL LOW (ref >60)
Glucose, Bld: 99 mg/dL (ref 70–99)
Potassium: 4.7 mmol/L (ref 3.5–5.1)
Sodium: 137 mmol/L (ref 135–145)
Total Bilirubin: 0.7 mg/dL (ref 0.0–1.2)
Total Protein: 7 g/dL (ref 6.5–8.1)

## 2023-12-22 LAB — DIFFERENTIAL
Abs Immature Granulocytes: 0.02 10*3/uL (ref 0.00–0.07)
Basophils Absolute: 0.1 10*3/uL (ref 0.0–0.1)
Basophils Relative: 1 %
Eosinophils Absolute: 0.2 10*3/uL (ref 0.0–0.5)
Eosinophils Relative: 3 %
Immature Granulocytes: 0 %
Lymphocytes Relative: 22 %
Lymphs Abs: 1.7 10*3/uL (ref 0.7–4.0)
Monocytes Absolute: 0.7 10*3/uL (ref 0.1–1.0)
Monocytes Relative: 9 %
Neutro Abs: 5.1 10*3/uL (ref 1.7–7.7)
Neutrophils Relative %: 65 %

## 2023-12-22 LAB — I-STAT CHEM 8, ED
BUN: 22 mg/dL (ref 8–23)
Calcium, Ion: 1.22 mmol/L (ref 1.15–1.40)
Chloride: 102 mmol/L (ref 98–111)
Creatinine, Ser: 1.7 mg/dL — ABNORMAL HIGH (ref 0.61–1.24)
Glucose, Bld: 95 mg/dL (ref 70–99)
HCT: 38 % — ABNORMAL LOW (ref 39.0–52.0)
Hemoglobin: 12.9 g/dL — ABNORMAL LOW (ref 13.0–17.0)
Potassium: 4.7 mmol/L (ref 3.5–5.1)
Sodium: 138 mmol/L (ref 135–145)
TCO2: 29 mmol/L (ref 22–32)

## 2023-12-22 LAB — CBG MONITORING, ED: Glucose-Capillary: 89 mg/dL (ref 70–99)

## 2023-12-22 LAB — PROTIME-INR
INR: 1.4 — ABNORMAL HIGH (ref 0.8–1.2)
Prothrombin Time: 17 s — ABNORMAL HIGH (ref 11.4–15.2)

## 2023-12-22 LAB — URINALYSIS, ROUTINE W REFLEX MICROSCOPIC
Bilirubin Urine: NEGATIVE
Glucose, UA: NEGATIVE mg/dL
Hgb urine dipstick: NEGATIVE
Ketones, ur: NEGATIVE mg/dL
Leukocytes,Ua: NEGATIVE
Nitrite: NEGATIVE
Protein, ur: NEGATIVE mg/dL
Specific Gravity, Urine: 1.01 (ref 1.005–1.030)
pH: 8.5 — ABNORMAL HIGH (ref 5.0–8.0)

## 2023-12-22 LAB — APTT: aPTT: 32 s (ref 24–36)

## 2023-12-22 LAB — ETHANOL: Alcohol, Ethyl (B): <10 mg/dL (ref <10)

## 2023-12-22 MED ORDER — SODIUM CHLORIDE 0.9% FLUSH
3.0000 mL | Freq: Once | INTRAVENOUS | Status: AC
  Administered 2023-12-22: 3 mL via INTRAVENOUS

## 2023-12-22 MED ORDER — MIRTAZAPINE 15 MG PO TABS
7.5000 mg | ORAL_TABLET | Freq: Every day | ORAL | Status: DC
  Administered 2023-12-22 – 2023-12-23 (×2): 7.5 mg via ORAL
  Filled 2023-12-22 (×2): qty 1

## 2023-12-22 MED ORDER — IOHEXOL 350 MG/ML SOLN
75.0000 mL | Freq: Once | INTRAVENOUS | Status: AC | PRN
  Administered 2023-12-22: 75 mL via INTRAVENOUS

## 2023-12-22 MED ORDER — POLYETHYLENE GLYCOL 3350 17 G PO PACK: 17.0000 g | PACK | Freq: Every day | ORAL | Status: DC | PRN

## 2023-12-22 MED ORDER — TAMSULOSIN HCL 0.4 MG PO CAPS
0.4000 mg | ORAL_CAPSULE | Freq: Every day | ORAL | Status: DC
  Administered 2023-12-22 – 2023-12-24 (×3): 0.4 mg via ORAL
  Filled 2023-12-22 (×3): qty 1

## 2023-12-22 MED ORDER — ATORVASTATIN CALCIUM 40 MG PO TABS
40.0000 mg | ORAL_TABLET | Freq: Every day | ORAL | Status: DC
  Administered 2023-12-22 – 2023-12-24 (×3): 40 mg via ORAL
  Filled 2023-12-22 (×3): qty 1

## 2023-12-22 MED ORDER — NITROFURANTOIN MONOHYD MACRO 100 MG PO CAPS: 100.0000 mg | ORAL_CAPSULE | Freq: Two times a day (BID) | ORAL | Status: DC

## 2023-12-22 MED ORDER — AMIODARONE HCL 200 MG PO TABS
200.0000 mg | ORAL_TABLET | Freq: Every day | ORAL | Status: DC
  Administered 2023-12-22 – 2023-12-24 (×3): 200 mg via ORAL
  Filled 2023-12-22 (×3): qty 1

## 2023-12-22 MED ORDER — MELATONIN 3 MG PO TABS
3.0000 mg | ORAL_TABLET | Freq: Once | ORAL | Status: AC
  Administered 2023-12-22: 3 mg via ORAL
  Filled 2023-12-22: qty 1

## 2023-12-22 MED ORDER — SODIUM CHLORIDE 0.9 % IV BOLUS
500.0000 mL | Freq: Once | INTRAVENOUS | Status: AC
  Administered 2023-12-22: 500 mL via INTRAVENOUS

## 2023-12-22 MED ORDER — FOLIC ACID 1 MG PO TABS
500.0000 ug | ORAL_TABLET | Freq: Every day | ORAL | Status: DC
  Administered 2023-12-22 – 2023-12-24 (×3): 0.5 mg via ORAL
  Filled 2023-12-22 (×4): qty 1

## 2023-12-22 MED ORDER — APIXABAN 5 MG PO TABS
5.0000 mg | ORAL_TABLET | Freq: Two times a day (BID) | ORAL | Status: DC
  Administered 2023-12-22 – 2023-12-24 (×4): 5 mg via ORAL
  Filled 2023-12-22 (×4): qty 1

## 2023-12-22 MED ORDER — NITROFURANTOIN MONOHYD MACRO 100 MG PO CAPS
100.0000 mg | ORAL_CAPSULE | Freq: Two times a day (BID) | ORAL | Status: AC
  Administered 2023-12-22 – 2023-12-23 (×3): 100 mg via ORAL
  Filled 2023-12-22 (×3): qty 1

## 2023-12-22 MED ORDER — STROKE: EARLY STAGES OF RECOVERY BOOK: Freq: Once | Status: DC

## 2023-12-22 NOTE — Consult Note (Signed)
NEUROLOGY CONSULT NOTE   Date of service: December 22, 2023 Patient Name: Adam Stephens MRN:  [id number] DOB:  [birth year] Chief Complaint: "Increased left-sided weakness" Requesting Provider: Lorre Nick, MD  History of Present Illness  Adam Stephens is a 78 y.o. left-handed male with hx of dementia, A-fib on Eliquis, hypertension and stroke who presents with increased left-sided weakness and difficulty following commands.  He resides in a nursing facility and was noted to be at his baseline when he woke up this morning, but at 0710 when he went to the dining hall, he began leaning to the left and had increased left-sided weakness.  Per his wife, at baseline he has expressive aphasia but can usually understand what is being said to him and can follow commands.  He is unable to ambulate and is dependent for all ADLs, including feeding.  He received his last dose of Eliquis last night at the facility.  He has had some hematuria recently but no other noted symptoms per the facility. While in the ED, patient's symptoms were noted to improve, and he returned to baseline per family.  LKW: 0710 Modified rankin score: 5-Severe disability-bedridden, incontinent, needs constant attention IV Thrombolysis: No, on Eliquis EVT:No, Poor baseline MRS  NIHSS components Score: Comment  1a Level of Conscious 0[x]  1[]  2[]  3[]      1b LOC Questions 0[]  1[]  2[x]       1c LOC Commands 0[]  1[]  2[x]       2 Best Gaze 0[x]  1[]  2[]       3 Visual 0[x]  1[]  2[]  3[]      4 Facial Palsy 0[]  1[x]  2[]  3[]      5a Motor Arm - left 0[x]  1[]  2[]  3[]  4[]  UN[]    5b Motor Arm - Right 0[]  1[x]  2[]  3[]  4[]  UN[]    6a Motor Leg - Left 0[]  1[]  2[x]  3[]  4[]  UN[]    6b Motor Leg - Right 0[]  1[]  2[x]  3[]  4[]  UN[]    7 Limb Ataxia 0[x]  1[]  2[]  3[]  UN[]     8 Sensory 0[x]  1[]  2[]  UN[]      9 Best Language 0[]  1[]  2[]  3[x]      10 Dysarthria 0[]  1[]  2[x]  UN[]      11 Extinct. and Inattention 0[x]  1[]  2[]       TOTAL:15     Subsequent NIHSS  at 1020 1a Level of Conscious.: 0 1b LOC Questions: 0 1c LOC Commands: 0 2 Best Gaze: 0 3 Visual: 0 4 Facial Palsy: 1 5a Motor Arm - left: 2 5b Motor Arm - Right: 2 6a Motor Leg - Left: 0 6b Motor Leg - Right: 0 7 Limb Ataxia: 0 8 Sensory: 1 9 Best Language: 1 10 Dysarthria: 2 11 Extinct and Inattention.: 0 TOTAL: 9    ROS   Unable to ascertain due to altered mental status  Past History   Past Medical History:  Diagnosis Date   High cholesterol    Hypertension     No past surgical history on file.  Family History: No family history on file.  Social History  has no history on file for tobacco use, alcohol use, and drug use.  Allergies  Allergen Reactions   Aliskiren    Amlodipine    Hydrochlorothiazide     Medications   Current Facility-Administered Medications:    [START ON 12/23/2023]  stroke: early stages of recovery book, , Does not apply, Once, de Saintclair Halsted, Cortney E, NP   sodium chloride flush (NS) 0.9 % injection 3 mL, 3  mL, Intravenous, Once, Lorre Nick, MD  Current Outpatient Medications:    amiodarone (PACERONE) 200 MG tablet, Take 1 tablet (200 mg total) by mouth daily., Disp: , Rfl:    apixaban (ELIQUIS) 5 MG TABS tablet, Take 5 mg by mouth 2 (two) times daily., Disp: , Rfl:    atorvastatin (LIPITOR) 40 MG tablet, Take 40 mg by mouth at bedtime., Disp: , Rfl:    folic acid (FOLVITE) 800 MCG tablet, Take 400 mcg by mouth daily., Disp: , Rfl:    GOODSENSE CLEARLAX 17 GM/SCOOP powder, Take 17 g by mouth daily., Disp: , Rfl:    Melatonin 10 MG TABS, Take 10 mg by mouth at bedtime., Disp: , Rfl:    mirtazapine (REMERON) 7.5 MG tablet, Take 7.5 mg by mouth at bedtime., Disp: , Rfl:    tamsulosin (FLOMAX) 0.4 MG CAPS capsule, Take 0.4 mg by mouth at bedtime., Disp: , Rfl:   Vitals   Vitals:   2024-01-09 0940  BP: 115/79  SpO2: 100%    There is no height or weight on file to calculate BMI.  Physical Exam   Constitutional: Chronically  ill-appearing elderly patient in no acute distress Psych: Affect appropriate to situation.  Eyes: No scleral injection.  HENT: No OP obstruction.  Head: Normocephalic.  Respiratory: Effort normal, non-labored breathing.  GI: Soft.  No distension. There is no tenderness.  Skin: WDI.   Neurologic Examination    NEURO:  Mental Status: Patient was initially alert with no verbal output, later was alert, responding to name able to state that he was in the hospital and stated month was "January or February" Speech/Language: speech is with severe dysarthria  Cranial Nerves:  II: PERRL.  Blinks to threat bilaterally III, IV, VI: EOMI. Eyelids elevate symmetrically.  V: Sensation is intact to light touch and diminished on the left VII: Left facial droop VIII: hearing intact to voice. IX, X: Voice is dysarthric QI:ONGEXBMW shrug 5/5. XII: tongue is midline without fasciculations. Motor: Able to move all 4 extremities with antigravity strength, bilateral arms drift to bed, but this may be effort dependent Tone: is normal and bulk is normal Sensation- Intact to light touch bilaterally but diminished on the left Coordination: FTN intact bilaterally Gait- deferred   Labs/Imaging/Neurodiagnostic studies   CBC:  Recent Labs  Lab 01/09/2024 0933 01-09-24 0935  WBC 7.8  --   NEUTROABS 5.1  --   HGB 12.5* 12.9*  HCT 38.0* 38.0*  MCV 98.7  --   PLT 296  --    Basic Metabolic Panel:  Lab Results  Component Value Date   NA 138 Jan 09, 2024   K 4.7 2024-01-09   CO2 24 January 09, 2024   GLUCOSE 95 01-09-2024   BUN 22 09-Jan-2024   CREATININE 1.70 (H) 01/09/24   CALCIUM 10.1 2024-01-09   GFRNONAA 45 (L) January 09, 2024   Lipid Panel: No results found for: "LDLCALC" HgbA1c: No results found for: "HGBA1C" Urine Drug Screen: No results found for: "LABOPIA", "COCAINSCRNUR", "LABBENZ", "AMPHETMU", "THCU", "LABBARB"  Alcohol Level     Component Value Date/Time   ETH <10 01/09/2024 0933   INR   Lab Results  Component Value Date   INR 1.4 (H) 01-09-2024   APTT  Lab Results  Component Value Date   APTT 32 January 09, 2024   AED levels: No results found for: "PHENYTOIN", "ZONISAMIDE", "LAMOTRIGINE", "LEVETIRACETA"  CT Head without contrast(Personally reviewed): No acute abnormality, atrophy and chronic small vessel ischemic disease  CT angio Head and Neck with contrast(Personally  reviewed): No LVO, bilateral severe M2 stenoses, 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 left-handed male with history of dementia, A-fib on Eliquis, hypertension and stroke who presents from his facility with increased left-sided weakness, aphasia and difficulty following commands.  At baseline, he is dependent for all ADLs including feeding.  On presentation, he was nonverbal, unable to follow commands but able to mimic intermittently with bilateral upper and lower extremity weakness.  CT head revealed no acute abnormality, and no LVO was seen on CTA.  Following the emergency department, patient improved and was able to follow commands and speak in a dysarthric voice and was back to baseline per family.  Will proceed with workup for TIA or stroke, to include MRI and will also obtain EEG given concerns for seizure given history of dementia and stroke.  Will restart Eliquis if no stroke with hemorrhagic transformation seen on MRI.  RECOMMENDATIONS   - Admit for stroke workup - Permissive HTN x48 hrs from sx onset or until stroke ruled out by MRI goal BP <220/120. PRN labetalol or hydralazine if BP above these parameters. Avoid oral antihypertensives. - MRI brain wo contrast - TTE  - Check A1c and LDL + add statin per guidelines - Restart Eliquis if no stroke with hemorrhagic transformation seen on MRI - Routine EEG - q4 hr neuro checks - STAT head CT for any change in neuro exam - Tele -  PT/OT/SLP - Stroke education - Amb referral to neurology upon discharge   ______________________________________________________________________  Patient seen by NP with MD, MD to edit note as needed.  Signed, Cortney E Ernestina Columbia, NP [facility]    Attending Neurohospitalist Addendum Patient seen and examined with APP/Resident. Agree with the history and physical as documented above. Agree with the plan as documented, which I helped formulate. I have edited the note above to reflect my full findings and recommendations. I have independently reviewed the chart, obtained history, review of systems and examined the patient.I have personally reviewed pertinent head/neck/spine imaging (CT/MRI). Please feel free to call with any questions.  MRI brain showed no e/o acute ischemia. Will restart eliquis. TIA workup and EEG per above.  -- Bing Neighbors, MD [facility] [phone]  If 7pm- 7am, please page neurology on call as listed in AMION.

## 2023-12-22 NOTE — Procedures (Signed)
Patient Name: Adam Stephens  MRN: [id number]  Epilepsy Attending: Charlsie Quest  Referring Physician/Provider: Marjorie Smolder, NP  Date: 12/22/2023 Duration: 22.31 mins  Patient history:  78 y.o. left-handed male with hx of dementia, A-fib on Eliquis, hypertension and stroke who presents with increased left-sided weakness and difficulty following commands. EEG to evaluate for seizure  Level of alertness: Awake  AEDs during EEG study: None  Technical aspects: This EEG study was done with scalp electrodes positioned according to the 10-20 International system of electrode placement. Electrical activity was reviewed with band pass filter of 1-70Hz , sensitivity of 7 uV/mm, display speed of 29mm/sec with a 60Hz  notched filter applied as appropriate. EEG data were recorded continuously and digitally stored.  Video monitoring was available and reviewed as appropriate.  Description: The posterior dominant rhythm consists of 7 Hz activity of moderate voltage (25-35 uV) seen predominantly in posterior head regions, symmetric and reactive to eye opening and eye closing. EEG showed continuous generalized 6 to 7 Hz theta slowing. Hyperventilation and photic stimulation were not performed.     ABNORMALITY - Continuous slow, generalized - Background slow  IMPRESSION: This study is suggestive of mild diffuse encephalopathy. No seizures or epileptiform discharges were seen throughout the recording.  Please note lack of epileptiform activity during interictal EEG does not exclude the diagnosis of epilepsy.   Adam Stephens [name]

## 2023-12-22 NOTE — Assessment & Plan Note (Addendum)
Neurology was consulted due to possible code stroke, CT head and MRI brain negative in ED. CTA with multiple areas of stenosis likely contributing to presentation. -Admit to FMTS, Dr. Manson Passey attending -Progressive care - Obtain risk stratification labs, lipid panel, hemoglobin A1c -Neurology is consulted, appreciate recommendations -EEG ordered, pending -Restart Eliquis -Continuous cardiac monitoring -Vitals per floor -NIH stroke scale every 4 hours -PT/OT/SLP eval and treat -Follow-up neuro recs on restarting home antihypertensives and anti-HLD medications.

## 2023-12-22 NOTE — Progress Notes (Signed)
Echocardiogram 2D Echocardiogram has been performed.  Adam Stephens N Onesimo Lingard,RDCS 12/22/2023, 1:57 PM

## 2023-12-22 NOTE — Code Documentation (Signed)
Stroke Response Nurse Documentation Code Documentation  Adam Stephens is a 78 y.o. male arriving to [facility]  via [facility] on 12/22/23 with past medical hx of HTN, stroke 2024, cholesterol. On Eliquis (apixaban) daily, last dose 1/31 PM. Code stroke was activated by EMS, patient from [facility] facility with LKW 0710. Staff member was taking patient to dining room when he suddenly leaned to the left. When the daughter arrived she noticed he was a little lethargic. EMS called.   Stroke team at the bedside on patient arrival. Labs drawn and patient cleared for CT by EDP. Patient to CT with team. NIH 15, see flowsheet for details. CT/CTA obtained. No TNK d/t pt on Eliquis. Not IR candidate d/t baseline mRS. Care Plan: q2h NIH and vitals. Bedside handoff with ED RN Carollee Herter. Upon arriving to room, patient improved to NIH 6.    Scarlette Slice K  Rapid Response RN

## 2023-12-22 NOTE — ED Notes (Signed)
Called CCMD at [phone] to verify pt is being monitored.

## 2023-12-22 NOTE — ED Notes (Signed)
Spouse stated he has had blood in his urine since late summer or early fall. The pt was seen by a urologist but nothing was found.   The facility has noted recently an increased in blood in his urine.

## 2023-12-22 NOTE — H&P (Addendum)
Hospital Admission History and Physical Service Pager: [phone]  Patient name: Adam Stephens Medical record number: [id number] Date of Birth: 09-24-1946 Age: 78 y.o. Gender: male  Primary Care Provider: Pcp, No Consultants: neurology Code Status: DNR Preferred Emergency Contact: [name] has MPOA Contact Information     Name Relation Home Work Mobile   Volland,BRAD  [phone]  [phone]   Michael,Cathy Significant other   [phone]      Other Contacts   None on File      Chief Complaint: increased left sided weakness  Assessment and Plan: Adam Stephens is a 78 y.o. 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   Differential for presentation of this includes but is not limited to: TIA- seems most likely given symptom improvement since arrival and lack of ischemic changes/evidence of infarction on imaging. He also has a history of Afib and prior strokes with significant stenosis of vessels in the brain which would support a diagnosis of TIA Stroke- does have a history of prior strokes which increases his risk of subsequent events, but has had rapid resolution of symptoms and no evidence of intracranial process on imaging. 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  Seizure- given underlying dementia and prior stroke  Assessment & Plan TIA (transient ischemic attack) Neurology was consulted due to possible code stroke, CT head and MRI brain negative in ED. CTA with multiple areas of stenosis likely  contributing to presentation. -Admit to FMTS, Dr. Manson Passey attending -Progressive care - Obtain risk stratification labs, lipid panel, hemoglobin A1c -Neurology is consulted, appreciate recommendations -EEG ordered, pending -Restart Eliquis -Continuous cardiac monitoring -Vitals per floor -NIH stroke scale every 4 hours -PT/OT/SLP eval and treat -Follow-up neuro recs on restarting home antihypertensives and anti-HLD medications. Acute-on-chronic kidney injury (HCC) Baseline CKD3a (Creatinine 1.15), currently elevated to 1.70 therefore will hold nephrotoxic agents. Appears dry upon exam, likely prerenal component therefore will give fluid bolus. -500 mL NS bolus -Avoid nephrotoxic agents -Repeat BMP in the morning UTI (urinary tract infection) Diagnosed at memory care facility, no culture data available patient on Macrobid starting on 1/28 per review of records.  Some hematuria per wife, unclear if this is in the setting of UTI.  H/o urinary retention, will obtain bladder scans -UA -Complete UTI treatment course -Bladder scan q8h -Continue home tamsulosin 0.4 mg daily  Chronic and Stable Problems:  A-fib with RVR: Continue home amiodarone 200 mg daily Anemia: Hemoglobin improved from prior admission, will monitor with daily labs. Continue home folate . HTN: Stable HLD: Continue home atorvastatin 40mg  daily Alzheimer's disease: Continue Mirtazapine 7.5mg  at bedtime, delirium precautions   FEN/GI: Dysphagia 1 diet VTE Prophylaxis: Eliquis  Disposition: Progressive Care  History of Present Illness:  Adam Stephens is a 78 y.o. male presenting with increased left sided weakness and lethargy.  Per his wife the patient was described to her by caregivers at his memory care facility as lethargic and somewhat weaker than normal on the left side.  He does have left-sided weakness at baseline as  a residual from a prior stroke in June 2024 as well as some aphasic speech, but this was  worse than his baseline.  Normally he is able to squeeze her hand or return to hug but this morning when she arrived at the facility he could not do that and was leaning to the left.  He was also not moving his hands and had skipped breakfast which is not usual for him.  His wife also reports that he currently is being treated for a UTI and has been on Macrobid since 1/28.  The memory care facility has also reported that he has had blood in his urine.  He does go to the bathroom and does not require catheterization.  Since his prior stroke he is not able to walk or pivot, does mention before has residual left-sided weakness and aphasic speech.  He can be understood at times at baseline but it is very difficult and more mumbling.  Since coming to the ED, now more responsive. Per wife feels like he is coming around.  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  MRI was also negative for acute intracranial abnormalities.  CBC and CMP were unremarkable except for mild AKI and known anemia which was improved from prior admission.  Review Of Systems: Per HPI  Pertinent Past Medical History: Paroxysmal Afib - on eliquis and metoprolol HTN HLD Alzheimer's disease CVA (multiple, most recently in 04/2023) Urinary retention CKD3a Remainder reviewed in history tab.   Pertinent Past Surgical History: None reported Remainder reviewed in history tab.   Pertinent Social History: Tobacco use: Former, quit about 25 years ago. Smoked most his life. Alcohol use: No. 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 Other Substance use: No Lives at [facility]  Pertinent Family History: None pertinent Remainder reviewed in history tab.   Important Outpatient Medications: Not sure if he got meds today -will reach out to memory care  facility  Eliquis 5mg  BID Atorvastatin 40mg  daily Amiodarone 200mg  daily Miralax daily Folate daily Melatonin 10mg  at bedtime Remeron 7.5mg  daily at bedtime Flomax 0.4mg  daily at bedtime Macrobid 100mg  (1/28) Remainder reviewed in medication history.   Objective: BP 115/79 (BP Location: Right Arm)   Pulse (!) 49   Temp 97.8 F (36.6 C) (Oral)   Resp 12   Ht 5\' 10"  (1.778 m)   Wt 61.9 kg   SpO2 100%   BMI 19.58 kg/m  Exam: General: Elderly appearing gentleman, no distress Eyes: PERRLA EOMI, no scleral icterus Cardiovascular: RRR, 3/6 systolic murmur, no R/G Respiratory: CTAB, no increased WOB Gastrointestinal: Flat, soft, NTTP MSK: Appropriate bulk and tone Neuro: Grip strength equal bilaterally, decreased strength in the L upper extremity compared to the right. Equal strength in the BLLE. CN II-XII intact bilaterally  Labs:  CBC BMET  Recent Labs  Lab 12/22/23 0933 12/22/23 0935  WBC 7.8  --   HGB 12.5* 12.9*  HCT 38.0* 38.0*  PLT 296  --    Recent Labs  Lab 12/22/23 0933 12/22/23 0935  NA 137 138  K 4.7 4.7  CL 100 102  CO2 24  --   BUN 17 22  CREATININE 1.55* 1.70*  GLUCOSE 99 95  CALCIUM 10.1  --     Pertinent additional labs  PT- 17.0 INR-1.4 APTT- 32   EKG:  Normal sinus rhythm, positive axis, normal transition, no abnormal ST segment changes.  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 My impression: No current evidence of acute process.  Gerrit Heck, DO 12/22/2023, 11:03 AM PGY-1, [facility]  FPTS Intern pager: [phone], text pages welcome Secure chat group [facility] Teaching Service   Upper  Level Addendum:   I have seen and evaluated this patient along with Dr. Hyacinth Meeker and reviewed the above note, making necessary revisions as appropriate.  I agree with the medical decision making and physical exam as noted above.   Elberta Fortis, DO PGY-2, [facility] Family Medicine Residency

## 2023-12-22 NOTE — Care Plan (Signed)
FMTS Brief Progress Note  S: Saw patient during night rounds with daughter at bedside.  Patient said he is doing well and per daughter he seems to be back to his baseline.  Reports she was informed by patient's nursing home that he was initially lethargic and had worsening weakness on his left side earlier today.   O: BP (!) 131/90 (BP Location: Right Arm)   Pulse 65   Temp 98.5 F (36.9 C) (Axillary)   Resp 18   Ht 5\' 10"  (1.778 m)   Wt 61.9 kg   SpO2 97%   BMI 19.58 kg/m    Physical exam General: Alert, NAD, pleasant elderly male Pulm: Normal work of breathing on RA Neuro: Dysarthric Speech, oriented to person only, muscle strength on right extremity 5/5 and 4/5 on left.    A/P: 78 year old male with history of dementia, A-fib on Eliquis, hypertension, stroke with residual left-sided weakness admitted today for concern of worsening left-sided weakness suspected to be possible TIA versus stroke versus seizure.  Imaging including head CT and MRI with no acute intracranial process. -Neurology following, appreciate recs -To resume Eliquis if hemorrhagic stroke ruled out -Permissive hypertension for 48 hours, BP goal<220/120 -Every 4 hours neurochecks - Orders reviewed. Labs for AM ordered, which was adjusted as needed.     Jerre Simon, MD 12/22/2023, 8:25 PM PGY-3, [facility] Night Resident  Please page [phone] with questions.

## 2023-12-22 NOTE — Assessment & Plan Note (Signed)
Patient has remained afebrile.  Continued on antibiotics. -On nitrofurantoin date 2/5 -Bladder scan q8h -Continue home tamsulosin 0.4 mg daily

## 2023-12-22 NOTE — ED Notes (Signed)
Called service response to request nectar thick liquids be sent to pt admission room 3W02. Modified pt diet per protocol.

## 2023-12-22 NOTE — ED Provider Notes (Signed)
[place] EMERGENCY DEPARTMENT AT [facility] Provider Note   CSN: [id number] Arrival date & time: 12/22/23  7829  An emergency department physician performed an initial assessment on this suspected stroke patient at [phone].  History  Chief Complaint  Patient presents with   Code Stroke    Adam Stephens is a 78 y.o. male.  78 year old male with prior history of CVA with left-sided weakness as well as aphasia presents from nursing facility due to acute onset of worsening left-sided deficits.  According to wife who is at bedside, patient normally can use his left arm somewhat.  Was noted to be leaning more to the left side today.  His alertness was at his baseline.  EMS was called and blood sugar was 128.  Patient was found to be in A-fib which is chronic and he takes Eliquis.  Transport here for further evaluation       Home Medications Prior to Admission medications   Medication Sig Start Date End Date Taking? Authorizing Provider  amiodarone (PACERONE) 200 MG tablet Take 1 tablet (200 mg total) by mouth daily. 12/10/23   Levin Erp, MD  apixaban (ELIQUIS) 5 MG TABS tablet Take 5 mg by mouth 2 (two) times daily. 04/07/23   [provider]  atorvastatin (LIPITOR) 40 MG tablet Take 40 mg by mouth at bedtime.    [provider]  folic acid (FOLVITE) 800 MCG tablet Take 400 mcg by mouth daily.    [provider]  Zella Ball 17 GM/SCOOP powder Take 17 g by mouth daily. 08/08/23   [provider]  Melatonin 10 MG TABS Take 10 mg by mouth at bedtime.    [provider]  mirtazapine (REMERON) 7.5 MG tablet Take 7.5 mg by mouth at bedtime.    [provider]  tamsulosin (FLOMAX) 0.4 MG CAPS capsule Take 0.4 mg by mouth at bedtime.    [provider]      Allergies    Aliskiren, Amlodipine, and Hydrochlorothiazide    Review of Systems   Review of Systems  All other systems reviewed and are  negative.   Physical Exam Updated Vital Signs BP 115/79   SpO2 100%  Physical Exam Vitals and nursing note reviewed.  Constitutional:      General: He is not in acute distress.    Appearance: Normal appearance. He is well-developed. He is not toxic-appearing.  HENT:     Head: Normocephalic and atraumatic.  Eyes:     General: Lids are normal.     Conjunctiva/sclera: Conjunctivae normal.     Pupils: Pupils are equal, round, and reactive to light.  Neck:     Thyroid: No thyroid mass.     Trachea: No tracheal deviation.  Cardiovascular:     Rate and Rhythm: Normal rate and regular rhythm.     Heart sounds: Normal heart sounds. No murmur heard.    No gallop.  Pulmonary:     Effort: Pulmonary effort is normal. No respiratory distress.     Breath sounds: Normal breath sounds. No stridor. No decreased breath sounds, wheezing, rhonchi or rales.  Abdominal:     General: There is no distension.     Palpations: Abdomen is soft.     Tenderness: There is no abdominal tenderness. There is no rebound.  Musculoskeletal:        General: No tenderness. Normal range of motion.     Cervical back: Normal range of motion and neck supple.  Skin:  General: Skin is warm and dry.     Findings: No abrasion or rash.  Neurological:     Mental Status: He is alert and oriented to person, place, and time. Mental status is at baseline.     GCS: GCS eye subscore is 4. GCS verbal subscore is 5. GCS motor subscore is 6.     Cranial Nerves: Facial asymmetry present.     Sensory: No sensory deficit.     Motor: No tremor or abnormal muscle tone.     Comments: Strength is 4 out of 5 in left upper and left lower extremity.  5 of 5 on the right side.  Psychiatric:        Attention and Perception: Attention normal.        Speech: Speech normal.        Behavior: Behavior normal.     ED Results / Procedures / Treatments   Labs (all labs ordered are listed, but only abnormal results are displayed) Labs  Reviewed  PROTIME-INR - Abnormal; Notable for the following components:      Result Value   Prothrombin Time 17.0 (*)    INR 1.4 (*)    All other components within normal limits  CBC - Abnormal; Notable for the following components:   RBC 3.85 (*)    Hemoglobin 12.5 (*)    HCT 38.0 (*)    All other components within normal limits  COMPREHENSIVE METABOLIC PANEL - Abnormal; Notable for the following components:   Creatinine, Ser 1.55 (*)    GFR, Estimated 45 (*)    All other components within normal limits  I-STAT CHEM 8, ED - Abnormal; Notable for the following components:   Creatinine, Ser 1.70 (*)    Hemoglobin 12.9 (*)    HCT 38.0 (*)    All other components within normal limits  APTT  DIFFERENTIAL  ETHANOL  CBG MONITORING, ED    EKG None  Radiology CT HEAD CODE STROKE WO CONTRAST Result Date: 12/22/2023 CLINICAL DATA:  Code stroke.  Acute stroke suspected flu EXAM: CT HEAD WITHOUT CONTRAST TECHNIQUE: Contiguous axial images were obtained from the base of the skull through the vertex without intravenous contrast. RADIATION DOSE REDUCTION: This exam was performed according to the departmental dose-optimization program which includes automated exposure control, adjustment of the mA and/or kV according to patient size and/or use of iterative reconstruction technique. COMPARISON:  12/08/2023 FINDINGS: Brain: No evidence of acute infarction, hemorrhage, obstructive hydrocephalus, extra-axial collection or mass lesion/mass effect. Atrophy with ventriculomegaly, advanced. There is some degree of disproportionate subarachnoid spaces and callosum angle narrowing which can be seen with an element of communicating hydrocephalus. Chronic small vessel ischemic gliosis which is confluent in the deep white matter. Chronic lacune seen in the left pons, bilateral deep gray nuclei, and bilateral deep white matter. No evidence of progression. Vascular: No hyperdense vessel or unexpected calcification.  Skull: Normal. Negative for fracture or focal lesion. Sinuses/Orbits: No acute finding. ASPECTS [facility] Stroke Program Early CT Score) Not scored without localizing symptom. IMPRESSION: 1. No acute finding. 2. Advanced atrophy and chronic small vessel disease. Electronically Signed   By: Tiburcio Pea M.D.   On: 12/22/2023 09:48    Procedures Procedures    Medications Ordered in ED Medications  sodium chloride flush (NS) 0.9 % injection 3 mL (has no administration in time range)   stroke: early stages of recovery book (has no administration in time range)  iohexol (OMNIPAQUE) 350 MG/ML injection 75 mL (75 mLs  Intravenous Contrast Given 12/22/23 6962)    ED Course/ Medical Decision Making/ A&P                                 Medical Decision Making Amount and/or Complexity of Data Reviewed Labs: ordered. Radiology: ordered.   Patient seen by neurology.  CT of head showed no acute findings.  Patient not candidate for tPA due to being on Eliquis.  Patient has improving symptoms at this time.  Possibility of TIA.  Concern for possible new CVA and MRI ordered.  Request that patient be admitted to the medicine team.  Will consult hospitalist team        Final Clinical Impression(s) / ED Diagnoses Final diagnoses:  TIA (transient ischemic attack)    Rx / DC Orders ED Discharge Orders     None         Lorre Nick, MD 12/22/23 1016

## 2023-12-22 NOTE — Progress Notes (Signed)
Patient received from ED, alert and oriented X2, vital signs stable, tele box connected, family members at bedside, call bell within reach, will continue to monitor

## 2023-12-22 NOTE — ED Notes (Signed)
 Patient transported to MRI

## 2023-12-22 NOTE — Assessment & Plan Note (Addendum)
Diagnosed at memory care facility, no culture data available patient on Macrobid starting on 1/28 per review of records.  Some hematuria per wife, unclear if this is in the setting of UTI.  H/o urinary retention, will obtain bladder scans -UA -Complete UTI treatment course -Bladder scan q8h -Continue home tamsulosin 0.4 mg daily

## 2023-12-22 NOTE — Progress Notes (Signed)
Patient unavailable for EEG due to MRI. Next available tech will attempt eeg as schedule permits.

## 2023-12-22 NOTE — Progress Notes (Shared)
     Daily Progress Note Intern Pager: [phone]  Patient name: Adam Stephens Medical record number: [id number] Date of birth: 1946/04/13 Age: 78 y.o. Gender: male  Primary Care Provider: Pcp, No Consultants: Neurology Code Status: DNR  Pt Overview and Major Events to Date:  2/1-admitted to FMTS  Assessment and Plan: Adam Stephens a 78 year old male past medical history of A-fib, prior stroke with residual left-sided deficits and aphasia, severe Alzheimer's disease, HTN, HLD, urinary retention, CKD 3A, presenting from SNF for transient left-sided weakness suspected to be in the setting of TIA.  Assessment & Plan TIA (transient ischemic attack) Initially presenting with lethargy and worsening left-sided weakness.  Imaging including head CT and MRI with no acute intracranial process.  Given patient has returned to baseline suspect symptoms to be in the setting of TIA.  EEG was negative for seizure. -Neurology following, appreciate recs -Restart Eliquis -Continuous cardiac monitoring -Vitals per floor -PT/OT/SLP eval and treat  Acute-on-chronic kidney injury (HCC) Morning BMP pending, baseline Creatinine 1.15.  S/p bolus NS. -P.o. ad lib.  -Avoid nephrotoxic agents -Repeat BMP in the morning UTI (urinary tract infection) Patient has remained afebrile.  Complete course of antibiotics started from care facility. -On nitrofurantoin day 5/5 -Bladder scan q8h -Continue home tamsulosin 0.4 mg daily   Chronic and Stable Problems:  A-fib with RVR: Continue home amiodarone 200 mg daily Anemia: Hemoglobin improved from prior admission, will monitor with daily labs. Continue home folate . HTN: Stable HLD: Continue home atorvastatin 40mg  daily Alzheimer's disease: Continue Mirtazapine 7.5mg  at bedtime, delirium precautions  FEN/GI: Dysphagia 1 diet PPx: Eliquis Dispo:SNF pending clinical improvement .   Subjective:  Patient asleep with daughter at bedside.  He is doing well  and has no concerns at this time.  Daughter he is back to baseline and more than earlier yesterday.  Objective: Temp:  [97.8 F (36.6 C)-98.8 F (37.1 C)] 98.5 F (36.9 C) (02/01 2016) Pulse Rate:  [40-80] 65 (02/01 2016) Resp:  [12-23] 18 (02/01 2016) BP: (96-144)/(57-90) 131/90 (02/01 2016) SpO2:  [94 %-100 %] 97 % (02/01 2016) Weight:  [61.9 kg] 61.9 kg (02/01 1012) Physical Exam: General: Pleasant elderly male, NAD Cardiovascular: RRR, systolic murmur Respiratory: CTAB, normal WOB.  No wheezing or crackles Abdomen: Soft, no distention or tenderness Extremities: No BLE edema Neuro: Alert, dysarthric speech,  PERRL, muscle strength 5/5 on RUE RLE, 4/5 LUE LLE.  Laboratory: Most recent CBC Lab Results  Component Value Date   WBC 7.8 12/22/2023   HGB 12.9 (L) 12/22/2023   HCT 38.0 (L) 12/22/2023   MCV 98.7 12/22/2023   PLT 296 12/22/2023   Most recent BMP    Latest Ref Rng & Units 12/22/2023    9:35 AM  BMP  Glucose 70 - 99 mg/dL 95   BUN 8 - 23 mg/dL 22   Creatinine 8.41 - 1.24 mg/dL 3.24   Sodium 401 - 027 mmol/L 138   Potassium 3.5 - 5.1 mmol/L 4.7   Chloride 98 - 111 mmol/L 102     Imaging/Diagnostic Tests: No new images  Adam Simon, MD 12/22/2023, 11:07 PM  PGY-3, [facility] FPTS Intern pager: [phone], text pages welcome Secure chat group [facility] Teaching Service

## 2023-12-22 NOTE — Plan of Care (Signed)
  Problem: Ischemic Stroke/TIA Tissue Perfusion: Goal: Complications of ischemic stroke/TIA will be minimized Outcome: Progressing   Problem: Coping: Goal: Will identify appropriate support needs Outcome: Progressing   Problem: Health Behavior/Discharge Planning: Goal: Goals will be collaboratively established with patient/family Outcome: Progressing   Problem: Nutrition: Goal: Risk of aspiration will decrease Outcome: Progressing   Problem: Clinical Measurements: Goal: Respiratory complications will improve Outcome: Progressing Goal: Cardiovascular complication will be avoided Outcome: Progressing   Problem: Skin Integrity: Goal: Risk for impaired skin integrity will decrease Outcome: Progressing

## 2023-12-22 NOTE — Progress Notes (Signed)
PT Cancellation Note  Patient Details Name: DAMEL QUERRY MRN: [id number] DOB: 1946/02/04   Cancelled Treatment:    Reason Eval/Treat Not Completed: Patient at procedure or test/unavailable  EEG technologists in room with pt at this time. Will attempt formal evaluation tomorrow.  Kathlyn Sacramento, PT, DPT [facility]  Rehabilitation Services Physical Therapist Office: [phone] Website: [email or web address]   Berton Mount 12/22/2023, 3:48 PM

## 2023-12-22 NOTE — Assessment & Plan Note (Signed)
Morning BMP pending, baseline Creatinine 1.15.  S/p bolus NS. -P.o. ad lib.  -Avoid nephrotoxic agents -Repeat BMP in the morning

## 2023-12-22 NOTE — ED Notes (Signed)
Bedside EEG in progress.

## 2023-12-22 NOTE — ED Triage Notes (Signed)
Pt bib ems from [facility] c/o lethargic and increased left sided weakness. Staff noticed change in behavior at 07:10. Pt has hx CVA (deficits left side weakness, increased facial droop, aphasia, and able to follow commands. Dementia.  Pt baseline follow some commands, non-ambulatory, drool, aphasia   BP 136/84 HR 70-90 (Afib) 4L nasal canula 100% CBG 128

## 2023-12-22 NOTE — Assessment & Plan Note (Signed)
Initially presenting with lethargy and worsening left-sided weakness.  Imaging including head CT and MRI with no acute intracranial process.  Patient's symptoms suspected to be in the setting of TIA.  Currently at baseline. -Neurology following, appreciate recs -Restart Eliquis -Continuous cardiac monitoring -Vitals per floor -NIH stroke scale every 4 hours -PT/OT/SLP eval and treat

## 2023-12-22 NOTE — Progress Notes (Signed)
 EEG complete - results pending

## 2023-12-22 NOTE — Assessment & Plan Note (Addendum)
Baseline CKD3a (Creatinine 1.15), currently elevated to 1.70 therefore will hold nephrotoxic agents. Appears dry upon exam, likely prerenal component therefore will give fluid bolus. -500 mL NS bolus -Avoid nephrotoxic agents -Repeat BMP in the morning

## 2023-12-23 DIAGNOSIS — G8194 Hemiplegia, unspecified affecting left nondominant side: Secondary | ICD-10-CM | POA: Diagnosis not present

## 2023-12-23 DIAGNOSIS — N179 Acute kidney failure, unspecified: Secondary | ICD-10-CM | POA: Diagnosis not present

## 2023-12-23 DIAGNOSIS — R55 Syncope and collapse: Secondary | ICD-10-CM

## 2023-12-23 DIAGNOSIS — I6603 Occlusion and stenosis of bilateral middle cerebral arteries: Secondary | ICD-10-CM | POA: Diagnosis not present

## 2023-12-23 DIAGNOSIS — G459 Transient cerebral ischemic attack, unspecified: Secondary | ICD-10-CM | POA: Diagnosis not present

## 2023-12-23 LAB — BASIC METABOLIC PANEL
Anion gap: 9 (ref 5–15)
BUN: 17 mg/dL (ref 8–23)
CO2: 22 mmol/L (ref 22–32)
Calcium: 9.5 mg/dL (ref 8.9–10.3)
Chloride: 106 mmol/L (ref 98–111)
Creatinine, Ser: 1.34 mg/dL — ABNORMAL HIGH (ref 0.61–1.24)
GFR, Estimated: 54 mL/min — ABNORMAL LOW (ref >60)
Glucose, Bld: 89 mg/dL (ref 70–99)
Potassium: 3.8 mmol/L (ref 3.5–5.1)
Sodium: 137 mmol/L (ref 135–145)

## 2023-12-23 LAB — LIPID PANEL
Cholesterol: 108 mg/dL (ref 0–200)
HDL: 48 mg/dL (ref >40)
LDL Cholesterol: 52 mg/dL (ref 0–99)
Total CHOL/HDL Ratio: 2.3 {ratio}
Triglycerides: 41 mg/dL (ref <150)
VLDL: 8 mg/dL (ref 0–40)

## 2023-12-23 LAB — CBC
HCT: 35.2 % — ABNORMAL LOW (ref 39.0–52.0)
Hemoglobin: 11.7 g/dL — ABNORMAL LOW (ref 13.0–17.0)
MCH: 32.5 pg (ref 26.0–34.0)
MCHC: 33.2 g/dL (ref 30.0–36.0)
MCV: 97.8 fL (ref 80.0–100.0)
Platelets: 291 10*3/uL (ref 150–400)
RBC: 3.6 MIL/uL — ABNORMAL LOW (ref 4.22–5.81)
RDW: 14 % (ref 11.5–15.5)
WBC: 8.2 10*3/uL (ref 4.0–10.5)
nRBC: 0 % (ref 0.0–0.2)

## 2023-12-23 LAB — HEMOGLOBIN A1C
Hgb A1c MFr Bld: 5.4 % (ref 4.8–5.6)
Mean Plasma Glucose: 108.28 mg/dL

## 2023-12-23 MED ORDER — ASPIRIN 81 MG PO TBEC
81.0000 mg | DELAYED_RELEASE_TABLET | Freq: Every day | ORAL | Status: DC
Start: 2023-12-23 — End: 2023-12-24
  Administered 2023-12-23 – 2023-12-24 (×2): 81 mg via ORAL
  Filled 2023-12-23 (×2): qty 1

## 2023-12-23 MED ORDER — MELATONIN 3 MG PO TABS
3.0000 mg | ORAL_TABLET | Freq: Every evening | ORAL | Status: DC | PRN
  Administered 2023-12-23: 3 mg via ORAL
  Filled 2023-12-23: qty 1

## 2023-12-23 MED ORDER — SODIUM CHLORIDE 0.9 % IV BOLUS
500.0000 mL | Freq: Once | INTRAVENOUS | Status: AC
  Administered 2023-12-23: 500 mL via INTRAVENOUS

## 2023-12-23 NOTE — Progress Notes (Signed)
STROKE TEAM PROGRESS NOTE   SUBJECTIVE (INTERVAL HISTORY) His daughter and wife are at the bedside.  Overall his condition is back to baseline.  Per wife, patient had severe stroke in 03/2023 due to new diagnosis A-fib, with residual deficit of speech difficulty, swallowing difficulty, left leg weakness and not able to walk.  He lives in the nursing facility, this time admitted for lethargy, slumping to the chair, lost consciousness with tongue sticking out.  Currently much improved.  He was found to have AKI, now improving.  He is on nectar thick liquid here and also in nursing facility, concerning for dehydration.   OBJECTIVE Temp:  [97.7 F (36.5 C)-98.8 F (37.1 C)] 98.7 F (37.1 C) (02/02 1513) Pulse Rate:  [64-105] 68 (02/02 1513) Cardiac Rhythm: Normal sinus rhythm (02/02 0700) Resp:  [13-23] 17 (02/02 1513) BP: (96-152)/(57-90) 123/70 (02/02 1513) SpO2:  [94 %-100 %] 95 % (02/02 1513)  Recent Labs  Lab 12/22/23 0930  GLUCAP 89   Recent Labs  Lab 12/22/23 0933 12/22/23 0935 12/23/23 0636  NA 137 138 137  K 4.7 4.7 3.8  CL 100 102 106  CO2 24  --  22  GLUCOSE 99 95 89  BUN 17 22 17   CREATININE 1.55* 1.70* 1.34*  CALCIUM 10.1  --  9.5   Recent Labs  Lab 12/22/23 0933  AST 24  ALT 8  ALKPHOS 108  BILITOT 0.7  PROT 7.0  ALBUMIN 3.7   Recent Labs  Lab 12/22/23 0933 12/22/23 0935 12/23/23 0636  WBC 7.8  --  8.2  NEUTROABS 5.1  --   --   HGB 12.5* 12.9* 11.7*  HCT 38.0* 38.0* 35.2*  MCV 98.7  --  97.8  PLT 296  --  291   No results for input(s): "CKTOTAL", "CKMB", "CKMBINDEX", "TROPONINI" in the last 168 hours. Recent Labs    12/22/23 0933  LABPROT 17.0*  INR 1.4*   Recent Labs    12/22/23 1849  COLORURINE YELLOW  LABSPEC 1.010  PHURINE 8.5*  GLUCOSEU NEGATIVE  HGBUR NEGATIVE  BILIRUBINUR NEGATIVE  KETONESUR NEGATIVE  PROTEINUR NEGATIVE  NITRITE NEGATIVE  LEUKOCYTESUR NEGATIVE       Component Value Date/Time   CHOL 108 12/23/2023 0636    TRIG 41 12/23/2023 0636   HDL 48 12/23/2023 0636   CHOLHDL 2.3 12/23/2023 0636   VLDL 8 12/23/2023 0636   LDLCALC 52 12/23/2023 0636   Lab Results  Component Value Date   HGBA1C 5.4 12/23/2023   No results found for: "LABOPIA", "COCAINSCRNUR", "LABBENZ", "AMPHETMU", "THCU", "LABBARB"  Recent Labs  Lab 12/22/23 0933  ETH <10    I have personally reviewed the radiological images below and agree with the radiology interpretations.  EEG adult Result Date: 12/22/2023 Charlsie Quest, MD     12/23/2023 12:03 AM Patient Name: Adam Stephens MRN: [id number] Epilepsy Attending: Charlsie Quest Referring Physician/Provider: Marjorie Smolder, NP Date: 12/22/2023 Duration: 22.31 mins Patient history:  78 y.o. left-handed male with hx of dementia, A-fib on Eliquis, hypertension and stroke who presents with increased left-sided weakness and difficulty following commands. EEG to evaluate for seizure Level of alertness: Awake AEDs during EEG study: None Technical aspects: This EEG study was done with scalp electrodes positioned according to the 10-20 International system of electrode placement. Electrical activity was reviewed with band pass filter of 1-70Hz , sensitivity of 7 uV/mm, display speed of 50mm/sec with a 60Hz  notched filter applied as appropriate. EEG data were recorded  continuously and digitally stored.  Video monitoring was available and reviewed as appropriate. Description: The posterior dominant rhythm consists of 7 Hz activity of moderate voltage (25-35 uV) seen predominantly in posterior head regions, symmetric and reactive to eye opening and eye closing. EEG showed continuous generalized 6 to 7 Hz theta slowing. Hyperventilation and photic stimulation were not performed.   ABNORMALITY - Continuous slow, generalized - Background slow IMPRESSION: This study is suggestive of mild diffuse encephalopathy. No seizures or epileptiform discharges were seen throughout the recording. Please note lack  of epileptiform activity during interictal EEG does not exclude the diagnosis of epilepsy. Charlsie Quest   ECHOCARDIOGRAM COMPLETE Result Date: 12/22/2023    ECHOCARDIOGRAM REPORT   Patient Name:   Adam Stephens Date of Exam: 12/22/2023 Medical Rec #:  [id number]      Height:       70.0 in Accession #:    [id number]     Weight:       136.5 lb Date of Birth:  02-02-1946      BSA:          1.774 m Patient Age:    78 years       BP:           113/79 mmHg Patient Gender: M              HR:           82 bpm. Exam Location:  Inpatient Procedure: 2D Echo, Cardiac Doppler and Color Doppler Indications:    Stroke  History:        Patient has no prior history of Echocardiogram examinations.                 Arrythmias:Atrial Fibrillation; Risk Factors:Dyslipidemia and                 Hypertension.  Sonographer:    Raeford Razor RDCS Referring Phys: [id number] CORTNEY E DE LA TORRE  Sonographer Comments: Technically difficult study due to poor echo windows. Image acquisition challenging due to uncooperative patient and Image acquisition challenging due to patient body habitus. IMPRESSIONS  1. Left ventricular ejection fraction, by estimation, is 60 to 65%. The left ventricle has normal function. The left ventricle has no regional wall motion abnormalities. There is mild left ventricular hypertrophy. Left ventricular diastolic parameters are consistent with Grade I diastolic dysfunction (impaired relaxation).  2. Right ventricular systolic function is normal. The right ventricular size is normal. Tricuspid regurgitation signal is inadequate for assessing PA pressure.  3. The mitral valve is abnormal. Trivial mitral valve regurgitation.  4. The aortic valve is calcified. Aortic valve regurgitation is not visualized. Mild to moderate aortic valve stenosis. Aortic valve area, by VTI measures 1.57 cm. Aortic valve mean gradient measures 10.0 mmHg. Aortic valve Vmax measures 2.17 m/s. Peak  gradient 18.8 mmHg, DI is 0.45.  5. The  inferior vena cava is normal in size with greater than 50% respiratory variability, suggesting right atrial pressure of 3 mmHg. Comparison(s): No prior Echocardiogram. FINDINGS  Left Ventricle: Left ventricular ejection fraction, by estimation, is 60 to 65%. The left ventricle has normal function. The left ventricle has no regional wall motion abnormalities. The left ventricular internal cavity size was normal in size. There is  mild left ventricular hypertrophy. Left ventricular diastolic parameters are consistent with Grade I diastolic dysfunction (impaired relaxation). Indeterminate filling pressures. Right Ventricle: The right ventricular size is normal. No increase in right ventricular wall thickness. Right ventricular  systolic function is normal. Tricuspid regurgitation signal is inadequate for assessing PA pressure. Left Atrium: Left atrial size was normal in size. Right Atrium: Right atrial size was normal in size. Pericardium: There is no evidence of pericardial effusion. Mitral Valve: The mitral valve is abnormal. Mild mitral annular calcification. Trivial mitral valve regurgitation. Tricuspid Valve: The tricuspid valve is grossly normal. Tricuspid valve regurgitation is trivial. Aortic Valve: The aortic valve is calcified. Aortic valve regurgitation is not visualized. Mild to moderate aortic stenosis is present. Aortic valve mean gradient measures 10.0 mmHg. Aortic valve peak gradient measures 18.8 mmHg. Aortic valve area, by VTI measures 1.57 cm. Pulmonic Valve: The pulmonic valve was normal in structure. Pulmonic valve regurgitation is not visualized. Aorta: The aortic root and ascending aorta are structurally normal, with no evidence of dilitation. Venous: The inferior vena cava is normal in size with greater than 50% respiratory variability, suggesting right atrial pressure of 3 mmHg. IAS/Shunts: No atrial level shunt detected by color flow Doppler.  LEFT VENTRICLE PLAX 2D LVIDd:         5.10 cm    Diastology LVIDs:         3.20 cm   LV e' medial:    6.53 cm/s LV PW:         1.10 cm   LV E/e' medial:  13.1 LV IVS:        0.80 cm   LV e' lateral:   8.49 cm/s LVOT diam:     2.10 cm   LV E/e' lateral: 10.1 LV SV:         60 LV SV Index:   34 LVOT Area:     3.46 cm  RIGHT VENTRICLE RV Basal diam:  2.90 cm LEFT ATRIUM             Index        RIGHT ATRIUM           Index LA diam:        3.40 cm 1.92 cm/m   RA Area:     15.20 cm LA Vol (A2C):   64.5 ml 36.35 ml/m  RA Volume:   36.80 ml  20.74 ml/m LA Vol (A4C):   46.6 ml 26.26 ml/m LA Biplane Vol: 57.1 ml 32.18 ml/m  AORTIC VALVE AV Area (Vmax):    1.54 cm AV Area (Vmean):   1.46 cm AV Area (VTI):     1.57 cm AV Vmax:           217.00 cm/s AV Vmean:          147.000 cm/s AV VTI:            0.383 m AV Peak Grad:      18.8 mmHg AV Mean Grad:      10.0 mmHg LVOT Vmax:         96.40 cm/s LVOT Vmean:        61.800 cm/s LVOT VTI:          0.174 m LVOT/AV VTI ratio: 0.45  AORTA Ao Root diam: 3.20 cm MITRAL VALVE MV Area (PHT): 3.60 cm     SHUNTS MV Decel Time: 211 msec     Systemic VTI:  0.17 m MV E velocity: 85.40 cm/s   Systemic Diam: 2.10 cm MV A velocity: 118.00 cm/s MV E/A ratio:  0.72 Zoila Shutter MD Electronically signed by Zoila Shutter MD Signature Date/Time: 12/22/2023/2:44:34 PM    Final    MR BRAIN WO CONTRAST Result  Date: 12/22/2023 CLINICAL DATA:  Stroke, follow up. EXAM: MRI HEAD WITHOUT CONTRAST TECHNIQUE: Multiplanar, multiecho pulse sequences of the brain and surrounding structures were obtained without intravenous contrast. COMPARISON:  Head CT and CTA 12/22/2023 FINDINGS: Brain: There is no evidence of an acute infarct, mass, midline shift, or extra-axial fluid collection. Scattered chronic microhemorrhages are present in both cerebral hemispheres, brainstem, and cerebellum including prominent involvement of the thalami suggestive of chronic hypertension. Confluent T2 hyperintensities in the cerebral white matter are nonspecific but  compatible with severe chronic small vessel ischemic disease. There are chronic lacunar infarcts involving the deep cerebral white matter and deep gray nuclei bilaterally. Chronic small vessel ischemia is also noted in the pons, including a discrete chronic lacunar infarct on the left. Advanced cerebral atrophy is again seen with associated ventriculomegaly, and an element of underlying communicating hydrocephalus is possible as noted on CT. Vascular: Major intracranial vascular flow voids are preserved. Skull and upper cervical spine: Unremarkable bone marrow signal. Sinuses/Orbits: Bilateral cataract extraction. Minimal mucosal thickening in the paranasal sinuses. Clear mastoid air cells. Other: None. IMPRESSION: 1. No acute intracranial abnormality. 2. 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 Electronically Signed   By: Sebastian Ache M.D.   On: 12/22/2023 12:05   CT ANGIO HEAD NECK W WO CM Result Date: 12/22/2023 CLINICAL DATA:  TIA. EXAM: CT ANGIOGRAPHY HEAD AND NECK WITH AND WITHOUT CONTRAST TECHNIQUE: Multidetector CT imaging of the head and neck was performed using the standard protocol during bolus administration of intravenous contrast. Multiplanar CT image reconstructions and MIPs were obtained to evaluate the vascular anatomy. Carotid stenosis measurements (when applicable) are obtained utilizing NASCET criteria, using the distal internal carotid diameter as the denominator. RADIATION DOSE REDUCTION: This exam was performed according to the departmental dose-optimization program which includes automated exposure control, adjustment of the mA and/or kV according to patient size and/or use of iterative reconstruction technique. CONTRAST:  75mL OMNIPAQUE IOHEXOL 350 MG/ML SOLN COMPARISON:  None Available. FINDINGS: CTA NECK FINDINGS Aortic arch: Normal variant aortic arch branching pattern with common origin of the brachiocephalic and left common carotid arteries. Patent  brachiocephalic and subclavian arteries with mild atherosclerosis not resulting in a significant stenosis. Right carotid system: Patent with a moderately large amount of mixed calcified and soft plaque in the carotid bulb resulting in 65% stenosis of the proximal ICA. Left carotid system: Patent with a small amount of calcified and soft plaque scattered throughout the common carotid artery and in the proximal ICA. No evidence of a significant stenosis. Vertebral arteries: Patent without evidence of a significant stenosis or dissection. Codominant. Skeleton: Advanced disc and facet degeneration in the cervical spine. Grade 1 anterolisthesis of C3 on C4 and C4 on C5. Other neck: No evidence of cervical lymphadenopathy or mass. Upper chest: Emphysema and mild biapical lung scarring, more fully evaluated on the recent prior chest CT. Review of the MIP images confirms the above findings CTA HEAD FINDINGS Anterior circulation: The internal carotid arteries are patent from skull base to carotid termini with atherosclerosis resulting in mild cavernous segment stenoses bilaterally. A 1.5 mm bulge from the right supraclinoid ICA in the posterior communicating region is favored to reflect an infundibulum. ACAs and MCAs are patent without evidence of a proximal branch occlusion or significant A1 or M1 stenosis. There are severe proximal M2 stenoses bilaterally. Posterior circulation: The intracranial vertebral arteries are widely patent to the basilar. Patent PICA, AICA, and SCA origins are visualized bilaterally. The basilar artery is  patent with atherosclerotic irregularity and up to mild narrowing. Posterior communicating arteries are diminutive or absent. There is a large anterior choroidal artery on the right which supplies a portion of the PCA territory. Both PCAs are patent with branch vessel irregularity but no evidence of a significant proximal stenosis. No aneurysm is identified. Venous sinuses: As permitted by  contrast timing, patent. Anatomic variants: Large right anterior choroidal artery supplying a portion of the PCA territory. Review of the MIP images confirms the above findings IMPRESSION: 1. No large vessel occlusion. 2. 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 3. 65% proximal right ICA stenosis. 4. Aortic Atherosclerosis (ICD10-I70.0) and Emphysema (ICD10-J43.9). Electronically Signed   By: Sebastian Ache M.D.   On: 12/22/2023 10:14   CT HEAD CODE STROKE WO CONTRAST Result Date: 12/22/2023 CLINICAL DATA:  Code stroke.  Acute stroke suspected flu EXAM: CT HEAD WITHOUT CONTRAST TECHNIQUE: Contiguous axial images were obtained from the base of the skull through the vertex without intravenous contrast. RADIATION DOSE REDUCTION: This exam was performed according to the departmental dose-optimization program which includes automated exposure control, adjustment of the mA and/or kV according to patient size and/or use of iterative reconstruction technique. COMPARISON:  12/08/2023 FINDINGS: Brain: No evidence of acute infarction, hemorrhage, obstructive hydrocephalus, extra-axial collection or mass lesion/mass effect. Atrophy with ventriculomegaly, advanced. There is some degree of disproportionate subarachnoid spaces and callosum angle narrowing which can be seen with an element of communicating hydrocephalus. Chronic small vessel ischemic gliosis which is confluent in the deep white matter. Chronic lacune seen in the left pons, bilateral deep gray nuclei, and bilateral deep white matter. No evidence of progression. Vascular: No hyperdense vessel or unexpected calcification. Skull: Normal. Negative for fracture or focal lesion. Sinuses/Orbits: No acute finding. ASPECTS [facility] Stroke Program Early CT Score) Not scored without localizing symptom. IMPRESSION: 1. No acute finding. 2. Advanced atrophy and chronic small vessel disease. Electronically Signed   By: Tiburcio Pea M.D.   On:  12/22/2023 09:48   CT CHEST ABDOMEN PELVIS W CONTRAST Result Date: 12/08/2023 CLINICAL DATA:  Fall with chest and abdomen pain. EXAM: CT CHEST, ABDOMEN, AND PELVIS WITH CONTRAST TECHNIQUE: Multidetector CT imaging of the chest, abdomen and pelvis was performed following the standard protocol during bolus administration of intravenous contrast. RADIATION DOSE REDUCTION: This exam was performed according to the departmental dose-optimization program which includes automated exposure control, adjustment of the mA and/or kV according to patient size and/or use of iterative reconstruction technique. CONTRAST:  75mL OMNIPAQUE IOHEXOL 350 MG/ML SOLN COMPARISON:  Same day chest radiograph and pelvis radiograph. FINDINGS: CT CHEST FINDINGS Cardiovascular: Vascular calcifications are seen in the coronary arteries and aortic arch. Normal heart size. No pericardial effusion. Mediastinum/Nodes: No enlarged mediastinal, hilar, or axillary lymph nodes. Thyroid gland, trachea, and esophagus demonstrate no significant findings. Lungs/Pleura: Mild bilateral ground-glass opacities and interlobular septal line thickening is noted. There is mild bilateral dependent atelectasis. Mild emphysematous changes are noted. No pleural effusion or pneumothorax. Musculoskeletal: Left eighth through tenth rib fractures are favored to be chronic. There are chronic anterior wedge deformities of T5, T6 T10, T12 with up to 25-50% height loss anteriorly. Degenerative changes are seen in the spine. CT ABDOMEN PELVIS FINDINGS Hepatobiliary: No focal liver abnormality is seen. No gallstones, gallbladder wall thickening, or biliary dilatation. Pancreas: Unremarkable. No pancreatic ductal dilatation or surrounding inflammatory changes. Spleen: Normal in size without focal abnormality. Adrenals/Urinary Tract: Adrenal glands are unremarkable. Kidneys are mildly atrophic, without renal calculi, focal lesion, or  hydronephrosis. The urinary bladder is  distended and demonstrates diffuse bladder wall thickening with trabeculation. Stomach/Bowel: Stomach is within normal limits. There is mild circumferential thickening of the rectum without significant surrounding inflammatory changes. Appendix appears normal. No evidence of bowel obstruction. Vascular/Lymphatic: Aortic atherosclerosis. No enlarged abdominal or pelvic lymph nodes. Reproductive: No suspicious finding. Other: No abdominal wall hernia or abnormality. No abdominopelvic ascites. Musculoskeletal: Degenerative changes are seen in the spine. IMPRESSION: 1. Mild bilateral ground-glass opacities and interlobular septal line thickening may reflect mild pulmonary edema. 2. Left eighth through tenth rib fractures are favored to be chronic. Correlation with point tenderness in this area could be helpful to confirm. 3. Mild circumferential thickening of the rectum without significant surrounding inflammatory changes may reflect proctitis. 4. Diffuse bladder wall thickening with trabeculation may reflect chronic bladder outlet obstruction. Aortic Atherosclerosis (ICD10-I70.0) and Emphysema (ICD10-J43.9). Electronically Signed   By: Romona Curls M.D.   On: 12/08/2023 11:39   CT Head Wo Contrast Result Date: 12/08/2023 CLINICAL DATA:  78 year old male with history of trauma from a fall. Head and neck pain. EXAM: CT HEAD WITHOUT CONTRAST CT CERVICAL SPINE WITHOUT CONTRAST TECHNIQUE: Multidetector CT imaging of the head and cervical spine was performed following the standard protocol without intravenous contrast. Multiplanar CT image reconstructions of the cervical spine were also generated. RADIATION DOSE REDUCTION: This exam was performed according to the departmental dose-optimization program which includes automated exposure control, adjustment of the mA and/or kV according to patient size and/or use of iterative reconstruction technique. COMPARISON:  CT head and cervical spine 06/13/2023. FINDINGS: CT HEAD  FINDINGS Brain: Severe cerebral and cerebellar atrophy. Patchy and confluent areas of decreased attenuation are noted throughout the deep and periventricular white matter of the cerebral hemispheres bilaterally, compatible with chronic microvascular ischemic disease. Ex vacuo dilatation of the ventricular system. Several more well-defined focal areas of low attenuation scattered throughout the basal ganglia bilaterally, and in the left side of the medulla, indicative of old lacunar infarcts. No evidence of acute infarction, hemorrhage, hydrocephalus, extra-axial collection or mass lesion/mass effect. Vascular: No hyperdense vessel or unexpected calcification. Skull: Normal. Negative for fracture or focal lesion. Sinuses/Orbits: No acute finding. Other: Soft tissue swelling in the right frontal scalp. CT CERVICAL SPINE FINDINGS Alignment: 2 mm of anterolisthesis of C4 upon C5. Alignment is otherwise anatomic. Skull base and vertebrae: No acute fracture. No primary bone lesion or focal pathologic process. Soft tissues and spinal canal: No prevertebral fluid or swelling. No visible canal hematoma. Disc levels: Multilevel degenerative disc disease, most severe at C5-C6 and C6-C7. Moderate multilevel facet arthropathy. Upper chest: Mild paraseptal emphysema. Bilateral apical nodular pleuroparenchymal thickening and architectural distortion, most compatible with areas of chronic post infectious or inflammatory scarring. Other: Aortic atherosclerosis. IMPRESSION: 1. Right frontal scalp contusion without underlying calvarial fracture or acute intracranial abnormality. 2. No evidence of acute traumatic injury to the cervical spine. 3. 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 4. Multilevel degenerative disc disease and cervical spondylosis, as above. 5. Aortic atherosclerosis. Aortic  Atherosclerosis (ICD10-I70.0) and Emphysema (ICD10-J43.9). Electronically Signed   By: Trudie Reed M.D.   On: 12/08/2023 08:52   CT Cervical Spine Wo Contrast Result Date: 12/08/2023 CLINICAL DATA:  78 year old male with history of trauma from a fall. Head and neck pain. EXAM: CT HEAD WITHOUT CONTRAST CT CERVICAL SPINE WITHOUT CONTRAST TECHNIQUE: Multidetector CT imaging of the head and cervical  spine was performed following the standard protocol without intravenous contrast. Multiplanar CT image reconstructions of the cervical spine were also generated. RADIATION DOSE REDUCTION: This exam was performed according to the departmental dose-optimization program which includes automated exposure control, adjustment of the mA and/or kV according to patient size and/or use of iterative reconstruction technique. COMPARISON:  CT head and cervical spine 06/13/2023. FINDINGS: CT HEAD FINDINGS Brain: Severe cerebral and cerebellar atrophy. Patchy and confluent areas of decreased attenuation are noted throughout the deep and periventricular white matter of the cerebral hemispheres bilaterally, compatible with chronic microvascular ischemic disease. Ex vacuo dilatation of the ventricular system. Several more well-defined focal areas of low attenuation scattered throughout the basal ganglia bilaterally, and in the left side of the medulla, indicative of old lacunar infarcts. No evidence of acute infarction, hemorrhage, hydrocephalus, extra-axial collection or mass lesion/mass effect. Vascular: No hyperdense vessel or unexpected calcification. Skull: Normal. Negative for fracture or focal lesion. Sinuses/Orbits: No acute finding. Other: Soft tissue swelling in the right frontal scalp. CT CERVICAL SPINE FINDINGS Alignment: 2 mm of anterolisthesis of C4 upon C5. Alignment is otherwise anatomic. Skull base and vertebrae: No acute fracture. No primary bone lesion or focal pathologic process. Soft tissues and spinal canal: No  prevertebral fluid or swelling. No visible canal hematoma. Disc levels: Multilevel degenerative disc disease, most severe at C5-C6 and C6-C7. Moderate multilevel facet arthropathy. Upper chest: Mild paraseptal emphysema. Bilateral apical nodular pleuroparenchymal thickening and architectural distortion, most compatible with areas of chronic post infectious or inflammatory scarring. Other: Aortic atherosclerosis. IMPRESSION: 1. Right frontal scalp contusion without underlying calvarial fracture or acute intracranial abnormality. 2. No evidence of acute traumatic injury to the cervical spine. 3. 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 4. Multilevel degenerative disc disease and cervical spondylosis, as above. 5. Aortic atherosclerosis. Aortic Atherosclerosis (ICD10-I70.0) and Emphysema (ICD10-J43.9). Electronically Signed   By: Trudie Reed M.D.   On: 12/08/2023 08:52   DG Pelvis Portable Result Date: 12/08/2023 CLINICAL DATA:  78 year old male history of trauma from a fall. Pelvic pain. EXAM: PORTABLE PELVIS 1-2 VIEWS COMPARISON:  No priors. FINDINGS: Single view of the bony pelvis demonstrates no definite acute displaced fracture of the bony pelvic ring. Bilateral proximal femurs as visualized appear intact, and the femoral heads project over the acetabula bilaterally. There is joint space narrowing, subchondral sclerosis, subchondral cyst formation and osteophyte formation in the hip joints bilaterally indicative of osteoarthritis. IMPRESSION: 1. No acute radiographic abnormality of the bony pelvis. 2. Moderate osteoarthritis of the hips bilaterally. Electronically Signed   By: Trudie Reed M.D.   On: 12/08/2023 08:31   DG Chest Port 1 View Result Date: 12/08/2023 CLINICAL DATA:  Fall.  Patient on Eliquis. EXAM: PORTABLE CHEST 1 VIEW COMPARISON:  06/13/2023 FINDINGS:  Patient is rotated to the right. Lungs are adequately inflated with mild prominence of the central perihilar markings suggesting of mild degree of vascular congestion. No focal airspace consolidation, effusion or pneumothorax. Cardiomediastinal silhouette is normal. Old lower lateral left rib fractures. No acute fracture visualized. Remainder of the exam is unchanged. IMPRESSION: 1.  No acute fracture. 2. Mild prominence of the central perihilar markings suggesting mild degree of vascular congestion. Electronically Signed   By: Elberta Fortis M.D.   On: 12/08/2023 08:30     PHYSICAL EXAM  Temp:  [97.7 F (36.5 C)-98.8 F (37.1 C)] 98.7 F (37.1 C) (02/02 1513)  Pulse Rate:  [64-105] 68 (02/02 1513) Resp:  [13-23] 17 (02/02 1513) BP: (96-152)/(57-90) 123/70 (02/02 1513) SpO2:  [94 %-100 %] 95 % (02/02 1513)  General - Well nourished, well developed, in no apparent distress.  Ophthalmologic - fundi not visualized due to noncooperation.  Cardiovascular - Regular rhythm and rate, not in A-fib now.  Neuro - awake, alert, eyes open, orientated to place, people, but not to time.  Severe dysarthria but able to name 3/4 and able to repeat sentences in severely dysarthric voice.  Able to speak thumbs short sentences in dysarthric voice, following all simple commands. No gaze palsy, tracking bilaterally, visual field full.  Mild left facial droop. Tongue midline.  Left upper extremity 4/5 proximally, distally 4+5, right upper extremity 5/5. Bilaterally LEs 4/5, no drift. Sensation symmetrical bilaterally, b/l FTN intact grossly but slow on the left, gait not tested.    ASSESSMENT/PLAN Mr. KEIFER HABIB is a 78 y.o. male with history of A-fib on Eliquis, dementia, hypertension, stroke, stiff resident admitted for lethargy, slumped in chair, and loss of consciousness. No TNK given due to window.    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 LDL 52 HgbA1c 5.4 Eliquis for VTE prophylaxis Eliquis (apixaban) daily prior to admission, now on Eliquis (apixaban) daily.  Add aspirin 81 on top of Eliquis given intracranial and extracranial stenosis. Ongoing aggressive stroke risk factor management Therapy recommendations: SNF Disposition: Pending  History of stroke 03/2013, admitted for difficulty walking with right leg weakness.  MRI showed small left SO infarct.  EF 55 to 60%.  Discharged on aspirin 81 Lipitor 40 03/2023 admitted in [place] for stroke due to no diagnosed A-fib.  Put on Eliquis.  Withdrew due to deficit of speech difficulty, dysphagia on nectar thick liquid, left-sided weakness and not walking in SNF.  PAF Diagnosed in 03/2023 On Eliquis PTA Continue Eliquis  AKI Dehydration Creatinine baseline 1.15 This admission creatinine 1.55--1.70--1.34 Encourage p.o. intake and fluid intake here and also in SNF Good urine output this time  Hypertension with intermittent hypotension BP fluctuation Avoid low BP Patient p.o. intake and fluid intake IV fluid as needed Long term BP goal normotensive  Hyperlipidemia Home meds: Lipitor 40 LDL 52, goal < 70 Now on Lipitor 40 Continue statin at discharge  Other Stroke Risk Factors Advanced age  Other Active Problems Dementia, memory unit resident  Hospital day # 1  Neurology will sign off. Please call with questions.  No neuro follow-up needed at this time.  Thanks for the consult.  Marvel Plan, MD PhD Stroke Neurology 12/23/2023 3:19 PM    To contact Stroke Continuity provider, please refer to [email or web address]. After hours, contact General Neurology

## 2023-12-23 NOTE — Evaluation (Signed)
Occupational Therapy Evaluation Patient Details Name: Adam Stephens MRN: [id number] DOB: [birth year] Today's Date: 12/23/2023   History of Present Illness Pt is a 78 y/o male from [facility] presenting with lethargy and increased L sided weakness. CT and MRI negative, EEG negative. PMH: dementia, afib on Eliquis, HTN, CVA with L sided weakness.   Clinical Impression   PTA patient from memory care facility, daughter reports pt needing total care for ADLs but able to use his arms to self feed. He requires max assist +2 for bed mobility and transfers into standing.  Cognitively, he is inconsistently following simple commands with increased time but he is difficult to understand. Anticipate he is near his baseline for ADLs and mobility.  Orthostatic vitals as below.  OT will follow if remains admitted, to address self feeding is at baseline, but anticipate no further needs after dc back to facility.       If plan is discharge home, recommend the following: Two people to help with walking and/or transfers;Two people to help with bathing/dressing/bathroom;Assistance with feeding;Direct supervision/assist for medications management;Direct supervision/assist for financial management;Assist for transportation;Help with stairs or ramp for entrance;Supervision due to cognitive status    Functional Status Assessment  Patient has had a recent decline in their functional status and demonstrates the ability to make significant improvements in function in a reasonable and predictable amount of time.  Equipment Recommendations  None recommended by OT    Recommendations for Other Services       Precautions / Restrictions Precautions Precautions: Fall Restrictions Weight Bearing Restrictions Per Provider Order: No      Mobility Bed Mobility Overal bed mobility: Needs Assistance Bed Mobility: Supine to Sit, Sit to Supine Rolling: Max assist   Supine to sit: Max assist, +2 for physical  assistance Sit to supine: Max assist, +2 for physical assistance        Transfers Overall transfer level: Needs assistance Equipment used: Rolling walker (2 wheels), 2 person hand held assist Transfers: Sit to/from Stand Sit to Stand: +2 physical assistance, Max assist           General transfer comment: Heavy posterior lean. Unable to get hip fully extended even with +2 max assist      Balance Overall balance assessment: Needs assistance Sitting-balance support: Bilateral upper extremity supported, Feet supported Sitting balance-Leahy Scale: Fair Sitting balance - Comments: Sat EOB with close supervision   Standing balance support: Bilateral upper extremity supported Standing balance-Leahy Scale: Zero Standing balance comment: +2 max                           ADL either performed or assessed with clinical judgement   ADL Overall ADL's : Needs assistance/impaired;At baseline                                       General ADL Comments: max-total care for ADLs.     Vision   Additional Comments: not formally assesesed     Perception         Praxis         Pertinent Vitals/Pain Pain Assessment Pain Assessment: PAINAD Breathing: normal Negative Vocalization: none Facial Expression: smiling or inexpressive Body Language: relaxed Consolability: no need to console PAINAD Score: 0 Pain Intervention(s): Monitored during session     Extremity/Trunk Assessment Upper Extremity Assessment Upper Extremity Assessment: Generalized  weakness;Difficult to assess due to impaired cognition   Lower Extremity Assessment Lower Extremity Assessment: Defer to PT evaluation       Communication Communication Communication: Difficulty communicating thoughts/reduced clarity of speech;Difficulty following commands/understanding Following commands: Follows one step commands inconsistently;Follows one step commands with increased time Cueing  Techniques: Verbal cues;Tactile cues   Cognition Arousal: Lethargic Behavior During Therapy: Flat affect Overall Cognitive Status: History of cognitive impairments - at baseline                                 General Comments: pt difficult to understand, follows some simple commands with increased time     General Comments  See assessment for vital, negative orthostatics    Exercises     Shoulder Instructions      Home Living Family/patient expects to be discharged to:: Skilled nursing facility                             Home Equipment: Wheelchair - manual   Additional Comments: memory care- [facility]      Prior Functioning/Environment Prior Level of Function : Needs assist             Mobility Comments: daughter reports needing assist for transfers to w/c, minimal pushing in wc to "get closer to a table" but no distance ADLs Comments: needs assist for all ADls, but able to self feed        OT Problem List: Decreased strength;Decreased activity tolerance;Impaired balance (sitting and/or standing);Decreased cognition      OT Treatment/Interventions:      OT Goals(Current goals can be found in the care plan section) Acute Rehab OT Goals Patient Stated Goal: none stated OT Goal Formulation: Patient unable to participate in goal setting  OT Frequency:      Co-evaluation              AM-PAC OT "6 Clicks" Daily Activity     Outcome Measure Help from another person eating meals?: A Lot Help from another person taking care of personal grooming?: A Lot Help from another person toileting, which includes using toliet, bedpan, or urinal?: Total Help from another person bathing (including washing, rinsing, drying)?: Total Help from another person to put on and taking off regular upper body clothing?: Total Help from another person to put on and taking off regular lower body clothing?: Total 6 Click Score: 8   End of Session  Equipment Utilized During Treatment: Gait belt;Rolling walker (2 wheels) Nurse Communication: Mobility status  Activity Tolerance: Patient tolerated treatment well Patient left: in bed;with call bell/phone within reach;with bed alarm set;Other (comment) (PT present)  OT Visit Diagnosis: Other abnormalities of gait and mobility (R26.89);Muscle weakness (generalized) (M62.81)                Time: 1610-9604 OT Time Calculation (min): 18 min Charges:  OT General Charges $OT Visit: 1 Visit OT Evaluation $OT Eval Moderate Complexity: 1 Mod  Barry Brunner, OT Acute Rehabilitation Services Office [phone]   Adam Stephens 12/23/2023, 10:11 AM

## 2023-12-23 NOTE — Hospital Course (Addendum)
Adam Stephens is a 78 y.o. male who was admitted to the [facility] Teaching Service at [facility] for worsening left side weakness. Hospital course is outlined below by system.   Possible TIA Admitted from his care facility for worsening left-sided weakness.  Initial workup including head CT and MRI with negative for acute intracranial process.  Neurology consulted and EEG was obtained which was negative for seizure activity.  Within 24 hours of admission returned to baseline.  Symptom was concerning for possible TIA.  Home Eliquis was held briefly but restarted by discharge. Neurology recommended start ASA 81 mg daily. Will start Famotidine 20 mg daily for gastric protection.   Mild-moderate aortic stenosis Aortic stenosis seen on echo which show EF of 60-65% and grade 1 diastolic dysfunction.  PCP follow-up recommendations 1) Needs cardiology follow-up for mild-moderate aortic stenosis seen on echo. 2) Liberalized diet at SNF 3) BP monitoring

## 2023-12-23 NOTE — Discharge Instructions (Addendum)
Dear Adam Stephens,   Thank you for letting us participate in your care! In this section, you will find a brief hospital admission summary of why you were admitted to the hospital and post-hospital plan.  You were admitted because you were experiencing worsening left-sided weakness which we suspect is most likely due to transient ischemic attack.  Symptoms improved and you were discharged once you return to baseline.   POST-HOSPITAL & CARE INSTRUCTIONS Please let PCP/Specialists know of any changes in medications that were made.  Please see medications section of this packet for any medication changes.  Please follow up with [facility] and [facility] (contact info is attached)  DOCTOR'S APPOINTMENTS & FOLLOW UP No future appointments.   Thank you for choosing [facility]! Take care and be well!  Family Medicine Teaching Service Inpatient Team [place]  [facility]  [address] [place], [place] [address] [phone]

## 2023-12-23 NOTE — Evaluation (Signed)
Physical Therapy Evaluation Patient Details Name: Adam Stephens MRN: [id number] DOB: September 09, 1946 Today's Date: 12/23/2023  History of Present Illness  Pt is a 78 y/o male from [facility] presenting with lethargy and increased L sided weakness. CT and MRI negative, EEG negative. PMH: dementia, afib on Eliquis, HTN, CVA with L sided weakness.  Clinical Impression  At baseline pt at memory care facility and requires max assist to transfer to w/c. Is w/c bound and dependent for propulsion more than a couple of feet. Pt currently at baseline and no further PT recommended. Recommend medical transport back to facility. Attempted orthostatic vitals. Able to get supine and sitting. Standing requires 2 person assist to hold pt up so standing pressure not completed until pt sitting back down. HR increased from 94 supine to 149 standing. Values below.  Orthostatic BPs  Supine 105/72 HR 94  Sitting 101/68 HR 126  Standing  94/79 HR 149           If plan is discharge home, recommend the following:     Can travel by private vehicle        Equipment Recommendations None recommended by PT  Recommendations for Other Services       Functional Status Assessment       Precautions / Restrictions Precautions Precautions: Fall      Mobility  Bed Mobility     Rolling: Max assist              Transfers Overall transfer level: Needs assistance Equipment used: Rolling walker (2 wheels), 2 person hand held assist Transfers: Sit to/from Stand Sit to Stand: +2 physical assistance, Max assist           General transfer comment: Heavy posterior lean. Unable to get hip fully extended even with +2 max assist    Ambulation/Gait               General Gait Details: Non ambulatory at baseline  Stairs            Wheelchair Mobility     Tilt Bed    Modified Rankin (Stroke Patients Only)       Balance   Sitting-balance support: Bilateral upper extremity  supported, Feet supported, No upper extremity supported Sitting balance-Leahy Scale: Fair Sitting balance - Comments: Sat EOB with close supervision   Standing balance support: Bilateral upper extremity supported Standing balance-Leahy Scale: Zero Standing balance comment: +2 max                             Pertinent Vitals/Pain Pain Assessment Pain Assessment: PAINAD Breathing: normal Negative Vocalization: none Facial Expression: smiling or inexpressive Body Language: relaxed Consolability: no need to console PAINAD Score: 0    Home Living                   Home Equipment: Wheelchair - manual      Prior Function                       Extremity/Trunk Assessment                Communication   Communication Communication: Difficulty communicating thoughts/reduced clarity of speech Cueing Techniques: Verbal cues;Tactile cues  Cognition  General Comments General comments (skin integrity, edema, etc.): See assessment for vitals    Exercises     Assessment/Plan    PT Assessment    PT Problem List         PT Treatment Interventions      PT Goals (Current goals can be found in the Care Plan section)       Frequency       Co-evaluation               AM-PAC PT "6 Clicks" Mobility  Outcome Measure Help needed turning from your back to your side while in a flat bed without using bedrails?: A Lot Help needed moving from lying on your back to sitting on the side of a flat bed without using bedrails?: Total Help needed moving to and from a bed to a chair (including a wheelchair)?: Total Help needed standing up from a chair using your arms (e.g., wheelchair or bedside chair)?: Total Help needed to walk in hospital room?: Total Help needed climbing 3-5 steps with a railing? : Total 6 Click Score: 7    End of Session Equipment Utilized During Treatment: Gait  belt Activity Tolerance: Patient tolerated treatment well Patient left: in bed;with call bell/phone within reach;with bed alarm set;with family/visitor present Nurse Communication: Mobility status PT Visit Diagnosis: Other abnormalities of gait and mobility (R26.89)    Time: 7253-6644 PT Time Calculation (min) (ACUTE ONLY): 27 min   Charges:   PT Evaluation $PT Eval Moderate Complexity: 1 Mod   PT General Charges $$ ACUTE PT VISIT: 1 Visit         [facility] PT Acute Rehabilitation Services Office [phone]   Angelina Ok [facility] 12/23/2023, 8:49 AM

## 2023-12-24 DIAGNOSIS — G8194 Hemiplegia, unspecified affecting left nondominant side: Secondary | ICD-10-CM | POA: Diagnosis not present

## 2023-12-24 DIAGNOSIS — I6603 Occlusion and stenosis of bilateral middle cerebral arteries: Secondary | ICD-10-CM | POA: Diagnosis not present

## 2023-12-24 DIAGNOSIS — G459 Transient cerebral ischemic attack, unspecified: Secondary | ICD-10-CM | POA: Diagnosis not present

## 2023-12-24 LAB — BASIC METABOLIC PANEL
Anion gap: 6 (ref 5–15)
BUN: 24 mg/dL — ABNORMAL HIGH (ref 8–23)
CO2: 24 mmol/L (ref 22–32)
Calcium: 9.3 mg/dL (ref 8.9–10.3)
Chloride: 110 mmol/L (ref 98–111)
Creatinine, Ser: 1.29 mg/dL — ABNORMAL HIGH (ref 0.61–1.24)
GFR, Estimated: 56 mL/min — ABNORMAL LOW (ref >60)
Glucose, Bld: 93 mg/dL (ref 70–99)
Potassium: 4 mmol/L (ref 3.5–5.1)
Sodium: 140 mmol/L (ref 135–145)

## 2023-12-24 MED ORDER — FAMOTIDINE 20 MG PO TABS: 20.0000 mg | ORAL_TABLET | Freq: Every day | ORAL | 0 refills | Status: DC

## 2023-12-24 MED ORDER — MELATONIN 3 MG PO TABS: 3.0000 mg | ORAL_TABLET | Freq: Every evening | ORAL | Status: DC | PRN

## 2023-12-24 MED ORDER — FOLIC ACID 1 MG PO TABS: 500.0000 ug | ORAL_TABLET | Freq: Every day | ORAL | 0 refills | Status: DC

## 2023-12-24 MED ORDER — ASPIRIN 81 MG PO TBEC: 81.0000 mg | DELAYED_RELEASE_TABLET | Freq: Every day | ORAL | Status: DC

## 2023-12-24 MED ORDER — ASPIRIN 81 MG PO TBEC: 81.0000 mg | DELAYED_RELEASE_TABLET | Freq: Every day | ORAL | 0 refills | Status: DC

## 2023-12-24 MED ORDER — MELATONIN 3 MG PO TABS: 3.0000 mg | ORAL_TABLET | Freq: Every evening | ORAL | 0 refills | Status: DC | PRN

## 2023-12-24 MED ORDER — FOLIC ACID 1 MG PO TABS: 500.0000 ug | ORAL_TABLET | Freq: Every day | ORAL | Status: DC

## 2023-12-24 NOTE — Plan of Care (Signed)

## 2023-12-24 NOTE — Progress Notes (Signed)
Report called to Annice Pih, Med tech.  More alert and talkative.  Denies pain or complaints.

## 2023-12-24 NOTE — Progress Notes (Signed)
Discharged to SNF.  Transported by [facility].

## 2023-12-24 NOTE — Evaluation (Signed)
Clinical/Bedside Swallow Evaluation Patient Details  Name: Adam Stephens MRN: [id number] Date of Birth: 1945/12/06  Today's Date: 12/24/2023 Time: SLP Start Time (ACUTE ONLY): 1025 SLP Stop Time (ACUTE ONLY): 1040 SLP Time Calculation (min) (ACUTE ONLY): 15 min  Past Medical History:  Past Medical History:  Diagnosis Date   High cholesterol    Hypertension    Past Surgical History: History reviewed. No pertinent surgical history. HPI:  Pt is a 78 y/o male from [facility] presenting with lethargy and increased L sided weakness. Dx encephalopathy due to syncope, dehydration with hypotension and AKI. CT and MRI negative for acute event; shows advanced chronic small vessel ischemic disease and cerebral  atrophy.  EEG negative. 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 Pt had MBS in May of 2024 after stroke - dysphagia 1/thin liquids was recommended. Liquids were changed to nectar consistency after admission to [facility]. Pt's son and significant other, Lynden Ang, report that he requires total assist for feeding.  Coughing has been minimized since transition to nectars, but they worry he does not get adequate hydration since he requires others to feed him.    Assessment / Plan / Recommendation  Clinical Impression  Pt presents with functional swallowing. Baseline diet is dysphagia 1/nectars.  Mild left facial asymmetry; tongue midline.  Edentulous.  He requires total assist for feeding at baseline. Demonstrated functional attention to POs with active oral phase, palpable swallow, no oral residue post-swallow, no s/s of aspiration with nectar liquids and purees.  Pt's son and significant other, Lynden Ang, were amenable to trying thin liquids at the bedside.  Single sips did not elicit concerns for airway protection; rapid swallows of water in quick succession led only to mild throat-clearing.  MBS, either while admitted or as an OP,  was offered.    After discussion, we determined it may be in pt's best interest to try the water protocol when he goes back to the facility.  This would allow sips of water between meals to facilitate hydration.  Handout re: water protocol was provided to them.  We spoke at length about pt's excellent overall swallow function today; we discussed the ways that dementia can impact swallowing/eating as the disease progresses. They were very realistic about the future; understood the signs of diminishing swallow function; they have determined that a PEG/NG is not an avenue they would want to pursue.    Recommend that Mr. Tatar be discharged on a dysphagia 1 diet; continue nectar thick liquids during meals. Allow thin water between meals and after oral care. No further acute care SLP f/u is needed - our service will sign off. Family agrees with recs/plan.  SLP Visit Diagnosis: Dysphagia, oropharyngeal phase (R13.12)    Aspiration Risk       Diet Recommendation   Thin;Dysphagia 1 (puree)  Medication Administration: Crushed with puree    Other  Recommendations Oral Care Recommendations: Oral care BID    Recommendations for follow up therapy are one component of a multi-disciplinary discharge planning process, led by the attending physician.  Recommendations may be updated based on patient status, additional functional criteria and insurance authorization.  Follow up Recommendations No SLP follow up        Swallow Study   General Date of Onset: 12/22/23 HPI: Pt is a 78 y/o male from [facility] presenting with lethargy and increased L sided weakness. Dx encephalopathy due to syncope, dehydration with hypotension  and AKI. CT and MRI negative for acute event; shows advanced chronic small vessel ischemic disease and cerebral  atrophy.  EEG negative. 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 Pt had MBS in May of 2024 after stroke -  dysphagia 1/thin liquids was recommended. Liquids were changed to nectar consistency after admission to [facility]. Pt's son and significant other, Lynden Ang, report that he requires total assist for feeding.  Coughing has been minimized since transition to nectars, but they worry he does not get adequate hydration since he requires others to feed him. Type of Study: Bedside Swallow Evaluation Previous Swallow Assessment: [facility] system Diet Prior to this Study: Dysphagia 1 (pureed);Mildly thick liquids (Level 2, nectar thick) Temperature Spikes Noted: No Respiratory Status: Room air History of Recent Intubation: No Behavior/Cognition: Lethargic/Drowsy Oral Cavity Assessment: Within Functional Limits Oral Care Completed by SLP: No Oral Cavity - Dentition: Edentulous Self-Feeding Abilities: Total assist Patient Positioning: Upright in bed Baseline Vocal Quality: Low vocal intensity Volitional Cough: Strong Volitional Swallow: Able to elicit    Oral/Motor/Sensory Function Overall Oral Motor/Sensory Function: Mild impairment Facial ROM: Reduced left Facial Symmetry: Abnormal symmetry left;Suspected CN VII (facial) dysfunction   Ice Chips Ice chips: Within functional limits   Thin Liquid Thin Liquid: Impaired Presentation: Straw Pharyngeal  Phase Impairments: Throat Clearing - Immediate    Nectar Thick Nectar Thick Liquid: Within functional limits   Honey Thick Honey Thick Liquid: Not tested   Puree Puree: Within functional limits   Solid     Solid: Not tested      Blenda Mounts Laurice 12/24/2023,11:21 AM  Marchelle Folks L. Samson Frederic, MA CCC/SLP Clinical Specialist - Acute Care SLP Acute Rehabilitation Services Office number [phone]

## 2023-12-24 NOTE — TOC Transition Note (Signed)
Transition of Care [facility]) - Discharge Note   Patient Details  Name: Adam Stephens MRN: [id number] Date of Birth: [birth year]  Transition of Care [facility]) CM/SW Contact:  Baldemar Lenis, LCSW Phone Number: 12/24/2023, 2:12 PM   Clinical Narrative:   CSW updated by MD that patient is stable to return to [facility] today. CSW spoke with Candiss Norse at [facility], confirmed patient is in memory care and sent discharge information for review. Patient cleared to return. CSW updated son, [name], via phone and he is in agreement. Brad requested MD call to answer questions about discharge, CSW relayed message to MD. Transport arranged with [facility] for next available.  Nurse to call report to [phone].    Final next level of care: Memory Care Barriers to Discharge: Barriers Resolved   Patient Goals and CMS Choice Patient states their goals for this hospitalization and ongoing recovery are:: patient unable to participate in goal setting, not oriented CMS Medicare.gov Compare Post Acute Care list provided to:: Patient Represenative (must comment) Choice offered to / list presented to : Adult Children [place] ownership interest in [facility]provided to:: Adult Children    Discharge Placement              Patient chooses bed at: [facility] [place] Patient to be transferred to facility by: [facility] Name of family member notified: Brad Patient and family notified of of transfer: 12/24/23  Discharge Plan and Services Additional resources added to the After Visit Summary for                                       Social Drivers of Health (SDOH) Interventions SDOH Screenings   Food Insecurity: No Food Insecurity (01/02/2023)   Received from [facility]  Transportation Needs: No Transportation Needs (03/31/2023)   Received from [facility], [facility]  Utilities: Not At Risk (01/02/2023)   Received from [facility], [facility]  Financial  Resource Strain: Low Risk  (01/02/2023)   Received from [facility], [facility]  Physical Activity: Unknown (07/11/2022)   Received from [facility], [facility]  Social Connections: Unknown (07/13/2023)   Received from [facility]  Stress: No Stress Concern Present (07/11/2022)   Received from [facility], [facility]  Tobacco Use: Medium Risk (09/06/2023)   Received from [facility]     Readmission Risk Interventions     No data to display

## 2023-12-24 NOTE — Assessment & Plan Note (Deleted)
Initially presenting with lethargy and worsening left-sided weakness.  Imaging including head CT and MRI with no acute intracranial process.  Given patient has returned to baseline suspect symptoms to be in the setting of TIA.  EEG was negative for seizure. Orthostatic VS positive with HR inc >30.  - Neurology following, appreciate recs - Restart Eliquis and ASA added - Continuous cardiac monitoring - Vitals per floor - PT/OT/SLP eval and treat

## 2023-12-24 NOTE — Discharge Summary (Addendum)
Family Medicine Teaching [facility] Discharge Summary  Patient name: Adam Stephens Medical record number: [id number] Date of birth: 1946/08/31 Age: 78 y.o. Gender: male Date of Admission: 12/22/2023  Date of Discharge: 12/24/23  Admitting Physician: Gerrit Heck, DO  Primary Care Provider: Pcp, No Consultants: Neurology  Indication for Hospitalization: TIA  Discharge Diagnoses/Problem List:  Principal Problem for Admission:  Other Problems addressed during stay:  Principal Problem:   TIA (transient ischemic attack) Active Problems:   Acute-on-chronic kidney injury (HCC)- improved    UTI (urinary tract infection)- present on admission  Brief Hospital Course:  Adam Stephens is a 78 y.o. male who was admitted to the [facility] Teaching Service at [facility] for worsening left side weakness. Hospital course is outlined below by system.   Possible TIA Admitted from his care facility for worsening left-sided weakness.  Initial workup including head CT and MRI with negative for acute intracranial process.  Neurology consulted and EEG was obtained which was negative for seizure activity.  Within 24 hours of admission returned to baseline.  Symptom was concerning for possible TIA.  Home Eliquis was held briefly but restarted by discharge. Neurology recommended start ASA 81 mg daily. Will start Famotidine 20 mg daily for gastric protection. TIA Thought to be due to hypotension due to low blood pressure with poor oral intake. Goal long term is normal blood pressure (120-130 systolic).     Mild-moderate aortic stenosis Aortic stenosis seen on echo which show EF of 60-65% and grade 1 diastolic dysfunction. Will need follow up with Cardiology   PCP follow-up recommendations 1) Needs cardiology follow-up for mild-moderate aortic stenosis seen on echo. 2) Liberalized diet at SNF 3) BP monitoring- encourage PO fluids, goal long term is normotensive   Disposition: Memory Care Facility  [facility]   Discharge Condition: Stable  Discharge Exam:  Vitals:   12/24/23 0756 12/24/23 1100  BP: 119/84 (!) 99/50  Pulse: 71 67  Resp: 20 20  Temp: 98.5 F (36.9 C) 98.4 F (36.9 C)  SpO2:  97%   General: Asleep in NAD HEENT: NCAT. No rhinorrhea. Cardiovascular: RRR. No M/R/G Respiratory: CTAB, normal WOB on RA. No wheezing, crackles, rhonchi, or diminished breath sounds. Abdomen: Soft, non-tender, non-distended. Bowel sounds normoactive Extremities: No BLE edema, no deformities or significant joint findings. Skin: Warm and dry Neuro: No focal neurological deficits.  Awake on my exam--communicates with son, denies pain   Significant Procedures: EEG adult This study is suggestive of mild diffuse encephalopathy. No seizures or epileptiform discharges were seen throughout the recording.   Echo LVEF 60-65%, no RWMA, G1DD (impaired relaxation), trivial MVR, mild-moderate aortic valve stenosis, > 50% respiratory variability  Significant Labs and Imaging:  Recent Labs  Lab 12/23/23 0636  WBC 8.2  HGB 11.7*  HCT 35.2*  PLT 291   Recent Labs  Lab 12/23/23 0636 12/24/23 0557  NA 137 140  K 3.8 4.0  CL 106 110  CO2 22 24  GLUCOSE 89 93  BUN 17 24*  CREATININE 1.34* 1.29*  CALCIUM 9.5 9.3   CT Head Code Stroke w/o contrast 1. No acute finding. 2. Advanced atrophy and chronic small vessel disease.  CT Angio Head Neck w/ w/o contrast 1. No large vessel occlusion. 2. 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 3. 65% proximal right ICA stenosis. 4. Aortic Atherosclerosis (ICD10-I70.0) and Emphysema (ICD10-J43.9).  MR Brain w/o contrast 1. No acute intracranial abnormality. 2. 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  Results/Tests Pending at Time of Discharge: none  Discharge Medications:  Allergies as of 12/24/2023       Reactions   Hydrochlorothiazide Other (See Comments)   Unknown reaction    Norvasc [amlodipine] Other (See Comments)   Unknown reaction   Tekturna [aliskiren] Other (See Comments)   Unknown reaction        Medication List     STOP taking these medications    nitrofurantoin (macrocrystal-monohydrate) 100 MG capsule Commonly known as: MACROBID       TAKE these medications    amiodarone 200 MG tablet Commonly known as: PACERONE Take 1 tablet (200 mg total) by mouth daily.   apixaban 5 MG Tabs tablet Commonly known as: ELIQUIS Take 5 mg by mouth 2 (two) times daily.   aspirin EC 81 MG tablet Take 1 tablet (81 mg total) by mouth daily. Swallow whole.   atorvastatin 40 MG tablet Commonly known as: LIPITOR Take 40 mg by mouth at bedtime.   famotidine 20 MG tablet Commonly known as: PEPCID Take 1 tablet (20 mg total) by mouth daily.   folic acid 1 MG tablet Commonly known as: FOLVITE Take 0.5 tablets (0.5 mg total) by mouth daily. What changed:  medication strength how much to take Another medication with the same name was removed. Continue taking this medication, and follow the directions you see here.   GoodSense ClearLax 17 GM/SCOOP powder Generic drug: polyethylene glycol powder Take 17 g by mouth every evening.   melatonin 3 MG Tabs tablet Take 1 tablet (3 mg total) by mouth at bedtime as needed. What changed:  medication strength how much to take when to take this reasons to take this   mirtazapine 7.5 MG tablet Commonly known as: REMERON Take 7.5 mg by mouth at bedtime.   tamsulosin 0.4 MG Caps capsule Commonly known as: FLOMAX Take 0.4 mg by mouth at bedtime.        Discharge Instructions: Please refer to Patient Instructions section of EMR for full details.  Patient was counseled important signs and symptoms that should prompt return to medical care, changes in medications, dietary instructions, activity restrictions, and follow up appointments.   Follow-Up Appointments:  Follow-up Information     [facility]. Schedule an appointment as soon as possible for a visit.   Specialty: Neurology Contact information: [address] [place] [address] [phone]        [facility]. Schedule an appointment as soon as possible for a visit.   Contact information: [address] [place] [address] [phone]                Fortunato Curling, DO 12/24/2023, 12:33 PM PGY-1, [facility] Family Medicine

## 2023-12-24 NOTE — NC FL2 (Addendum)
[place] MEDICAID FL2 LEVEL OF CARE FORM     IDENTIFICATION  Patient Name: Adam Stephens Birthdate: 03-19-1946 Sex: male Admission Date (Current Location): 12/22/2023  [facility] and [place] Number:  Producer, television/film/video and Address:  [facility] [place], [address] [place], [place] [address]      Provider Number: [id number]  Attending Physician Name and Address:  Westley Chandler, MD  Relative Name and Phone Number:       Current Level of Care: Hospital Recommended Level of Care: Memory Care Prior Approval Number:    Date Approved/Denied:   PASRR Number:    Discharge Plan: Memory Care    Current Diagnoses: Patient Active Problem List   Diagnosis Date Noted   TIA (transient ischemic attack) 12/22/2023   Acute-on-chronic kidney injury (HCC) 12/22/2023   UTI (urinary tract infection) 12/22/2023   Atrial fibrillation with RVR (HCC) 12/08/2023   Fall 12/08/2023   Anemia 12/08/2023    Orientation RESPIRATION BLADDER Height & Weight     Self, Place  Normal Incontinent Weight: 136 lb 7.4 oz (61.9 kg) Height:  5\' 10"  (177.8 cm)  BEHAVIORAL SYMPTOMS/MOOD NEUROLOGICAL BOWEL NUTRITION STATUS      Incontinent Diet (see DC summary)  AMBULATORY STATUS COMMUNICATION OF NEEDS Skin   Extensive Assist Verbally Bruising (right upper face contusion)                       Personal Care Assistance Level of Assistance  Bathing, Feeding, Dressing Bathing Assistance: Maximum assistance Feeding assistance: Maximum assistance Dressing Assistance: Maximum assistance     Functional Limitations Info  Sight Sight Info: Impaired        SPECIAL CARE FACTORS FREQUENCY                       Contractures Contractures Info: Not present    Additional Factors Info  Code Status, Allergies, Psychotropic Code Status Info: DNR Allergies Info: Hydrochlorothiazide, Norvasc (Amlodipine), Tekturna (Aliskiren) Psychotropic Info: Remeron 7.5mg  daily at bed             Discharge Medications: STOP taking these medications     nitrofurantoin (macrocrystal-monohydrate) 100 MG capsule Commonly known as: MACROBID           TAKE these medications     amiodarone 200 MG tablet Commonly known as: PACERONE Take 1 tablet (200 mg total) by mouth daily.    apixaban 5 MG Tabs tablet Commonly known as: ELIQUIS Take 5 mg by mouth 2 (two) times daily.    aspirin EC 81 MG tablet Take 1 tablet (81 mg total) by mouth daily. Swallow whole.    atorvastatin 40 MG tablet Commonly known as: LIPITOR Take 40 mg by mouth at bedtime.    famotidine 20 MG tablet Commonly known as: PEPCID Take 1 tablet (20 mg total) by mouth daily.    folic acid 1 MG tablet Commonly known as: FOLVITE Take 0.5 tablets (0.5 mg total) by mouth daily. What changed:  medication strength how much to take Another medication with the same name was removed. Continue taking this medication, and follow the directions you see here.    GoodSense ClearLax 17 GM/SCOOP powder Generic drug: polyethylene glycol powder Take 17 g by mouth every evening.    melatonin 3 MG Tabs tablet Take 1 tablet (3 mg total) by mouth at bedtime as needed. What changed:  medication strength how much to take when to take this reasons  to take this    mirtazapine 7.5 MG tablet Commonly known as: REMERON Take 7.5 mg by mouth at bedtime.    tamsulosin 0.4 MG Caps capsule Commonly known as: FLOMAX Take 0.4 mg by mouth at bedtime.    Relevant Imaging Results:  Relevant Lab Results:   Additional Information SS#: [id number]  Baldemar Lenis, LCSW

## 2023-12-24 NOTE — Assessment & Plan Note (Deleted)
Patient has remained afebrile.  Complete course of antibiotics started from care facility. - On nitrofurantoin day 5/5 - Bladder scan q8h - Continue home tamsulosin 0.4 mg daily

## 2023-12-24 NOTE — Evaluation (Signed)
Speech Language Pathology Evaluation Patient Details Name: Adam Stephens MRN: [id number] DOB: [birth year] Today's Date: 12/24/2023 Time: 1040-1057 SLP Time Calculation (min) (ACUTE ONLY): 17 min  Problem List:  Patient Active Problem List   Diagnosis Date Noted   TIA (transient ischemic attack) 12/22/2023   Acute-on-chronic kidney injury (HCC) 12/22/2023   UTI (urinary tract infection) 12/22/2023   Atrial fibrillation with RVR (HCC) 12/08/2023   Fall 12/08/2023   Anemia 12/08/2023   Past Medical History:  Past Medical History:  Diagnosis Date   High cholesterol    Hypertension    Past Surgical History: History reviewed. No pertinent surgical history. HPI:  Pt is a 78 y/o male from [facility] memory car presenting with lethargy and increased L sided weakness. Dx encephalopathy due to syncope, dehydration with hypotension and AKI. CT and MRI negative for acute event; shows advanced chronic small vessel ischemic disease and cerebral  atrophy.  EEG negative. 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 Pt had MBS in May of 2024 after stroke - dysphagia 1/thin liquids was recommended. Liquids were changed to nectar consistency after admission to [facility]. Pt's son and significant other, Lynden Ang, report that he requires total assist for feeding.  Coughing has been minimized since transition to nectars, but they worry he does not get adequate hydration since he requires others to feed him. At baseline, communication is marked by dysarthria; preserved ability to follow basic commands and participate in social conversations.   Assessment / Plan / Recommendation Clinical Impression  Pt presents with significant dysarthria of speech, at baseline after May 2024 CVA, and overlying progressing dementia.  He is still able to participate in basic social conversation with others, recognizes close family members, follows simple commands  (particularly in familiar contexts).  He names basic items and discriminates basic objects.  Family reports that, despite his grogginess, his communication is approximating baseline function. No further SLP f/u is recommended. Our service will sign off.    SLP Assessment  SLP Recommendation/Assessment: Patient does not need any further Speech Lanaguage Pathology Services SLP Visit Diagnosis: Dysarthria and anarthria (R47.1)    Recommendations for follow up therapy are one component of a multi-disciplinary discharge planning process, led by the attending physician.  Recommendations may be updated based on patient status, additional functional criteria and insurance authorization.    Follow Up Recommendations  No SLP follow up                       SLP Evaluation Cognition  Overall Cognitive Status: History of cognitive impairments - at baseline Arousal/Alertness: Lethargic Attention: Sustained Sustained Attention: Appears intact       Comprehension  Auditory Comprehension Overall Auditory Comprehension: Impaired at baseline Yes/No Questions: Within Functional Limits Commands: Impaired One Step Basic Commands: 75-100% accurate Other Conversation Comments: simple Visual Recognition/Discrimination Discrimination: Within Function Limits Reading Comprehension Reading Status: Not tested    Expression Expression Primary Mode of Expression: Verbal Verbal Expression Overall Verbal Expression: Impaired at baseline Initiation: Impaired Naming: No impairment Written Expression Dominant Hand: Left Written Expression: Not tested   Oral / Motor  Oral Motor/Sensory Function Overall Oral Motor/Sensory Function: Mild impairment Facial ROM: Reduced left Facial Symmetry: Abnormal symmetry left;Suspected CN VII (facial) dysfunction Motor Speech Overall Motor Speech: Impaired Respiration: Within functional limits Phonation: Low vocal intensity Articulation: Impaired Level of  Impairment: Word            Hannan Hutmacher,  Marchelle Folks Laurice 12/24/2023, 11:32 AM Jill Side. Samson Frederic, MA CCC/SLP Clinical Specialist - Acute Care SLP Acute Rehabilitation Services Office number [phone]

## 2023-12-24 NOTE — Plan of Care (Signed)

## 2023-12-24 NOTE — Assessment & Plan Note (Deleted)
Morning BMP pending, baseline Creatinine 1.15.  S/p bolus NS. - PO ad lib.  - Avoid nephrotoxic agents - Repeat BMP in the morning

## 2024-09-28 ENCOUNTER — Other Ambulatory Visit: Payer: Self-pay

## 2024-09-28 ENCOUNTER — Inpatient Hospital Stay (HOSPITAL_COMMUNITY)
Admission: EM | Admit: 2024-09-28 | Discharge: 2024-10-02 | DRG: 065 | Disposition: A | Discharge status: Hospice | Source: Skilled Nursing Facility | Attending: Neurology | Admitting: Neurology

## 2024-09-28 ENCOUNTER — Emergency Department (HOSPITAL_COMMUNITY)

## 2024-09-28 DIAGNOSIS — T45515A Adverse effect of anticoagulants, initial encounter: Secondary | ICD-10-CM | POA: Diagnosis not present

## 2024-09-28 DIAGNOSIS — Z993 Dependence on wheelchair: Secondary | ICD-10-CM

## 2024-09-28 DIAGNOSIS — R29726 NIHSS score 26: Secondary | ICD-10-CM | POA: Diagnosis present

## 2024-09-28 DIAGNOSIS — F0280 Dementia in other diseases classified elsewhere without behavioral disturbance: Secondary | ICD-10-CM | POA: Diagnosis present

## 2024-09-28 DIAGNOSIS — R001 Bradycardia, unspecified: Secondary | ICD-10-CM | POA: Diagnosis not present

## 2024-09-28 DIAGNOSIS — Z7901 Long term (current) use of anticoagulants: Secondary | ICD-10-CM | POA: Diagnosis not present

## 2024-09-28 DIAGNOSIS — Z79899 Other long term (current) drug therapy: Secondary | ICD-10-CM | POA: Diagnosis not present

## 2024-09-28 DIAGNOSIS — R0609 Other forms of dyspnea: Secondary | ICD-10-CM | POA: Diagnosis not present

## 2024-09-28 DIAGNOSIS — N1831 Chronic kidney disease, stage 3a: Secondary | ICD-10-CM | POA: Diagnosis present

## 2024-09-28 DIAGNOSIS — Z66 Do not resuscitate: Secondary | ICD-10-CM | POA: Diagnosis present

## 2024-09-28 DIAGNOSIS — R29810 Facial weakness: Secondary | ICD-10-CM | POA: Diagnosis present

## 2024-09-28 DIAGNOSIS — G309 Alzheimer's disease, unspecified: Secondary | ICD-10-CM | POA: Diagnosis present

## 2024-09-28 DIAGNOSIS — K219 Gastro-esophageal reflux disease without esophagitis: Secondary | ICD-10-CM | POA: Diagnosis present

## 2024-09-28 DIAGNOSIS — R1310 Dysphagia, unspecified: Secondary | ICD-10-CM | POA: Diagnosis present

## 2024-09-28 DIAGNOSIS — G253 Myoclonus: Secondary | ICD-10-CM | POA: Diagnosis present

## 2024-09-28 DIAGNOSIS — G8194 Hemiplegia, unspecified affecting left nondominant side: Secondary | ICD-10-CM | POA: Diagnosis present

## 2024-09-28 DIAGNOSIS — I619 Nontraumatic intracerebral hemorrhage, unspecified: Secondary | ICD-10-CM | POA: Diagnosis not present

## 2024-09-28 DIAGNOSIS — I959 Hypotension, unspecified: Secondary | ICD-10-CM | POA: Diagnosis present

## 2024-09-28 DIAGNOSIS — I69391 Dysphagia following cerebral infarction: Secondary | ICD-10-CM | POA: Diagnosis not present

## 2024-09-28 DIAGNOSIS — D6832 Hemorrhagic disorder due to extrinsic circulating anticoagulants: Secondary | ICD-10-CM | POA: Diagnosis present

## 2024-09-28 DIAGNOSIS — A419 Sepsis, unspecified organism: Secondary | ICD-10-CM | POA: Diagnosis not present

## 2024-09-28 DIAGNOSIS — N179 Acute kidney failure, unspecified: Secondary | ICD-10-CM | POA: Diagnosis present

## 2024-09-28 DIAGNOSIS — R54 Age-related physical debility: Secondary | ICD-10-CM | POA: Diagnosis present

## 2024-09-28 DIAGNOSIS — N183 Chronic kidney disease, stage 3 unspecified: Secondary | ICD-10-CM | POA: Diagnosis not present

## 2024-09-28 DIAGNOSIS — I4891 Unspecified atrial fibrillation: Secondary | ICD-10-CM | POA: Diagnosis present

## 2024-09-28 DIAGNOSIS — E7800 Pure hypercholesterolemia, unspecified: Secondary | ICD-10-CM | POA: Diagnosis present

## 2024-09-28 DIAGNOSIS — N390 Urinary tract infection, site not specified: Secondary | ICD-10-CM | POA: Diagnosis present

## 2024-09-28 DIAGNOSIS — I610 Nontraumatic intracerebral hemorrhage in hemisphere, subcortical: Secondary | ICD-10-CM | POA: Diagnosis not present

## 2024-09-28 DIAGNOSIS — I615 Nontraumatic intracerebral hemorrhage, intraventricular: Secondary | ICD-10-CM | POA: Diagnosis present

## 2024-09-28 DIAGNOSIS — Z515 Encounter for palliative care: Secondary | ICD-10-CM | POA: Diagnosis not present

## 2024-09-28 DIAGNOSIS — I739 Peripheral vascular disease, unspecified: Secondary | ICD-10-CM | POA: Diagnosis not present

## 2024-09-28 DIAGNOSIS — Z7982 Long term (current) use of aspirin: Secondary | ICD-10-CM

## 2024-09-28 DIAGNOSIS — R52 Pain, unspecified: Secondary | ICD-10-CM | POA: Diagnosis not present

## 2024-09-28 DIAGNOSIS — R569 Unspecified convulsions: Secondary | ICD-10-CM | POA: Diagnosis not present

## 2024-09-28 DIAGNOSIS — I129 Hypertensive chronic kidney disease with stage 1 through stage 4 chronic kidney disease, or unspecified chronic kidney disease: Secondary | ICD-10-CM | POA: Diagnosis present

## 2024-09-28 DIAGNOSIS — R0681 Apnea, not elsewhere classified: Secondary | ICD-10-CM | POA: Diagnosis not present

## 2024-09-28 DIAGNOSIS — R4182 Altered mental status, unspecified: Secondary | ICD-10-CM | POA: Diagnosis not present

## 2024-09-28 DIAGNOSIS — F03C0 Unspecified dementia, severe, without behavioral disturbance, psychotic disturbance, mood disturbance, and anxiety: Secondary | ICD-10-CM | POA: Diagnosis not present

## 2024-09-28 HISTORY — DX: Unspecified atrial fibrillation: I48.91

## 2024-09-28 HISTORY — DX: Unspecified dementia, unspecified severity, without behavioral disturbance, psychotic disturbance, mood disturbance, and anxiety: F03.90

## 2024-09-28 LAB — CBC
HCT: 33.2 % — ABNORMAL LOW (ref 39.0–52.0)
Hemoglobin: 10.6 g/dL — ABNORMAL LOW (ref 13.0–17.0)
MCH: 33 pg (ref 26.0–34.0)
MCHC: 31.9 g/dL (ref 30.0–36.0)
MCV: 103.4 fL — ABNORMAL HIGH (ref 80.0–100.0)
Platelets: 333 K/uL (ref 150–400)
RBC: 3.21 MIL/uL — ABNORMAL LOW (ref 4.22–5.81)
RDW: 14.6 % (ref 11.5–15.5)
WBC: 13.7 K/uL — ABNORMAL HIGH (ref 4.0–10.5)
nRBC: 0 % (ref 0.0–0.2)

## 2024-09-28 LAB — COMPREHENSIVE METABOLIC PANEL WITH GFR
ALT: 10 U/L (ref 0–44)
AST: 15 U/L (ref 15–41)
Albumin: 2.9 g/dL — ABNORMAL LOW (ref 3.5–5.0)
Alkaline Phosphatase: 63 U/L (ref 38–126)
Anion gap: 10 (ref 5–15)
BUN: 26 mg/dL — ABNORMAL HIGH (ref 8–23)
CO2: 23 mmol/L (ref 22–32)
Calcium: 9 mg/dL (ref 8.9–10.3)
Chloride: 103 mmol/L (ref 98–111)
Creatinine, Ser: 1.7 mg/dL — ABNORMAL HIGH (ref 0.61–1.24)
GFR, Estimated: 41 mL/min — ABNORMAL LOW (ref >60)
Glucose, Bld: 97 mg/dL (ref 70–99)
Potassium: 4.7 mmol/L (ref 3.5–5.1)
Sodium: 136 mmol/L (ref 135–145)
Total Bilirubin: 0.3 mg/dL (ref 0.0–1.2)
Total Protein: 6.3 g/dL — ABNORMAL LOW (ref 6.5–8.1)

## 2024-09-28 LAB — I-STAT CHEM 8, ED
BUN: 27 mg/dL — ABNORMAL HIGH (ref 8–23)
Calcium, Ion: 1.16 mmol/L (ref 1.15–1.40)
Chloride: 105 mmol/L (ref 98–111)
Creatinine, Ser: 2 mg/dL — ABNORMAL HIGH (ref 0.61–1.24)
Glucose, Bld: 87 mg/dL (ref 70–99)
HCT: 32 % — ABNORMAL LOW (ref 39.0–52.0)
Hemoglobin: 10.9 g/dL — ABNORMAL LOW (ref 13.0–17.0)
Potassium: 4.8 mmol/L (ref 3.5–5.1)
Sodium: 138 mmol/L (ref 135–145)
TCO2: 25 mmol/L (ref 22–32)

## 2024-09-28 LAB — DIFFERENTIAL
Abs Immature Granulocytes: 0.08 K/uL — ABNORMAL HIGH (ref 0.00–0.07)
Basophils Absolute: 0.1 K/uL (ref 0.0–0.1)
Basophils Relative: 1 %
Eosinophils Absolute: 0.5 K/uL (ref 0.0–0.5)
Eosinophils Relative: 4 %
Immature Granulocytes: 1 %
Lymphocytes Relative: 15 %
Lymphs Abs: 2.1 K/uL (ref 0.7–4.0)
Monocytes Absolute: 1 K/uL (ref 0.1–1.0)
Monocytes Relative: 8 %
Neutro Abs: 9.9 K/uL — ABNORMAL HIGH (ref 1.7–7.7)
Neutrophils Relative %: 71 %

## 2024-09-28 LAB — URINALYSIS, ROUTINE W REFLEX MICROSCOPIC
Bilirubin Urine: NEGATIVE
Glucose, UA: NEGATIVE mg/dL
Ketones, ur: NEGATIVE mg/dL
Nitrite: NEGATIVE
Protein, ur: 30 mg/dL — AB
Specific Gravity, Urine: 1.014 (ref 1.005–1.030)
WBC, UA: >50 WBC/hpf (ref 0–5)
pH: 7 (ref 5.0–8.0)

## 2024-09-28 LAB — PROTIME-INR
INR: 1.3 — ABNORMAL HIGH (ref 0.8–1.2)
Prothrombin Time: 16.5 s — ABNORMAL HIGH (ref 11.4–15.2)

## 2024-09-28 LAB — CBG MONITORING, ED: Glucose-Capillary: 83 mg/dL (ref 70–99)

## 2024-09-28 LAB — APTT: aPTT: 32 s (ref 24–36)

## 2024-09-28 LAB — I-STAT CG4 LACTIC ACID, ED: Lactic Acid, Venous: 0.6 mmol/L (ref 0.5–1.9)

## 2024-09-28 LAB — ETHANOL: Alcohol, Ethyl (B): <15 mg/dL (ref <15)

## 2024-09-28 MED ORDER — SODIUM CHLORIDE 0.9% FLUSH
3.0000 mL | Freq: Once | INTRAVENOUS | Status: AC
  Administered 2024-09-28: 3 mL via INTRAVENOUS

## 2024-09-28 MED ORDER — STROKE: EARLY STAGES OF RECOVERY BOOK
Freq: Once | Status: AC
  Administered 2024-09-29: 1
  Filled 2024-09-28: qty 1

## 2024-09-28 MED ORDER — ACETAMINOPHEN 160 MG/5ML PO SOLN: 650.0000 mg | ORAL | Status: DC | PRN

## 2024-09-28 MED ORDER — ACETAMINOPHEN 325 MG PO TABS: 650.0000 mg | ORAL_TABLET | ORAL | Status: DC | PRN

## 2024-09-28 MED ORDER — LEVETIRACETAM (KEPPRA) 500 MG/5 ML ADULT IV PUSH
3500.0000 mg | Freq: Once | INTRAVENOUS | Status: AC
  Administered 2024-09-28: 3500 mg via INTRAVENOUS

## 2024-09-28 MED ORDER — ACETAMINOPHEN 650 MG RE SUPP
650.0000 mg | RECTAL | Status: DC | PRN
  Administered 2024-09-29 – 2024-09-30 (×2): 650 mg via RECTAL
  Filled 2024-09-28 (×2): qty 1

## 2024-09-28 MED ORDER — SENNOSIDES-DOCUSATE SODIUM 8.6-50 MG PO TABS: 1.0000 | ORAL_TABLET | Freq: Two times a day (BID) | ORAL | Status: DC

## 2024-09-28 MED ORDER — IOHEXOL 350 MG/ML SOLN
75.0000 mL | Freq: Once | INTRAVENOUS | Status: AC | PRN
  Administered 2024-09-28: 75 mL via INTRAVENOUS

## 2024-09-28 MED ORDER — PANTOPRAZOLE SODIUM 40 MG IV SOLR
40.0000 mg | Freq: Every day | INTRAVENOUS | Status: DC
  Administered 2024-09-29 – 2024-09-30 (×3): 40 mg via INTRAVENOUS
  Filled 2024-09-28 (×3): qty 10

## 2024-09-28 MED ORDER — EMPTY CONTAINERS FLEXIBLE MISC
900.0000 mg | Freq: Once | Status: AC
  Administered 2024-09-28: 900 mg via INTRAVENOUS
  Filled 2024-09-28: qty 90

## 2024-09-28 MED ORDER — HYDRALAZINE HCL 20 MG/ML IJ SOLN
20.0000 mg | Freq: Once | INTRAMUSCULAR | Status: AC
  Administered 2024-09-28: 20 mg via INTRAVENOUS

## 2024-09-28 MED ORDER — CLEVIDIPINE BUTYRATE 0.5 MG/ML IV EMUL: 0.0000 mg/h | INTRAVENOUS | Status: DC

## 2024-09-28 MED ORDER — HYDRALAZINE HCL 20 MG/ML IJ SOLN
INTRAMUSCULAR | Status: AC
  Filled 2024-09-28: qty 1

## 2024-09-28 NOTE — H&P (Signed)
 NEUROLOGY CONSULT NOTE   Date of service: September 28, 2024 Patient Name: Adam Stephens MRN:  [id number] DOB:  06/19/1946 Chief Complaint: Seizure, L sided weakness and unreactive R pupil and poorly responsive, code stroke Requesting Provider: No att. providers found  History of Present Illness  Adam Stephens is a 78 y.o. male with hx of HTN, HLD, afibb on eliquis  and took last dose tonight at 2000. Last seen normal at 2100. Found to be seizing at 2142 and EMS called and then noted to be poorly responsive with L sided weakness and R pupil oblong and unreactive.  He is having intermittent L arm and L leg arhythmic twitching.     has a past medical history of High cholesterol and Hypertension.   LKW: *** Modified rankin score: {Modified Rankin Scale:21264} IV Thrombolysis: ***Yes, *** No (reason) EVT: ***Yes, *** No (reason) ICH Score:***  NIHSS components Score: Comment  1a Level of Conscious 0[]  1[]  2[]  3[]      1b LOC Questions 0[]  1[]  2[]       1c LOC Commands 0[]  1[]  2[]       2 Best Gaze 0[]  1[]  2[]       3 Visual 0[]  1[]  2[]  3[]      4 Facial Palsy 0[]  1[]  2[]  3[]      5a Motor Arm - left 0[]  1[]  2[]  3[]  4[]  UN[]    5b Motor Arm - Right 0[]  1[]  2[]  3[]  4[]  UN[]    6a Motor Leg - Left 0[]  1[]  2[]  3[]  4[]  UN[]    6b Motor Leg - Right 0[]  1[]  2[]  3[]  4[]  UN[]    7 Limb Ataxia 0[]  1[]  2[]  UN[]      8 Sensory 0[]  1[]  2[]  UN[]      9 Best Language 0[]  1[]  2[]  3[]      10 Dysarthria 0[]  1[]  2[]  UN[]      11 Extinct. and Inattention 0[]  1[]  2[]       TOTAL:       ROS  ***Comprehensive ROS performed and pertinent positives documented in HPI  ***Unable to ascertain due to ***  Past History   Past Medical History:  Diagnosis Date   High cholesterol    Hypertension     No past surgical history on file.  Family History: No family history on file.  Social History  has no history on file for tobacco use, alcohol use, and drug use.  Allergies  Allergen Reactions    Hydrochlorothiazide Other (See Comments)    Unknown reaction   Norvasc [Amlodipine] Other (See Comments)    Unknown reaction   Tekturna [Aliskiren] Other (See Comments)    Unknown reaction    Medications   Current Facility-Administered Medications:    sodium chloride  flush (NS) 0.9 % injection 3 mL, 3 mL, Intravenous, Once, Tegeler, Lonni PARAS, MD  Current Outpatient Medications:    amiodarone  (PACERONE ) 200 MG tablet, Take 1 tablet (200 mg total) by mouth daily., Disp: , Rfl:    apixaban  (ELIQUIS ) 5 MG TABS tablet, Take 5 mg by mouth 2 (two) times daily., Disp: , Rfl:    aspirin  EC 81 MG tablet, Take 1 tablet (81 mg total) by mouth daily. Swallow whole., Disp: 30 tablet, Rfl: 0   atorvastatin  (LIPITOR) 40 MG tablet, Take 40 mg by mouth at bedtime., Disp: , Rfl:    famotidine  (PEPCID ) 20 MG tablet, Take 1 tablet (20 mg total) by mouth daily., Disp: 30 tablet, Rfl: 0   folic acid  (FOLVITE ) 1 MG tablet, Take 0.5 tablets (0.5 mg  total) by mouth daily., Disp: 15 tablet, Rfl: 0   GOODSENSE CLEARLAX 17 GM/SCOOP powder, Take 17 g by mouth every evening., Disp: , Rfl:    melatonin 3 MG TABS tablet, Take 1 tablet (3 mg total) by mouth at bedtime as needed., Disp: 30 tablet, Rfl: 0   mirtazapine  (REMERON ) 7.5 MG tablet, Take 7.5 mg by mouth at bedtime., Disp: , Rfl:    tamsulosin  (FLOMAX ) 0.4 MG CAPS capsule, Take 0.4 mg by mouth at bedtime., Disp: , Rfl:   Vitals   There were no vitals filed for this visit.  There is no height or weight on file to calculate BMI.   Physical Exam   Constitutional: Appears well-developed and well-nourished. *** Psych: Affect appropriate to situation. *** Eyes: No scleral injection. *** HENT: No OP obstruction. *** Head: Normocephalic. *** Cardiovascular: Normal rate and regular rhythm. *** Respiratory: Effort normal, non-labored breathing. *** GI: Soft.  No distension. There is no tenderness. *** Skin: WDI. ***  Neurologic Examination    ***  Labs/Imaging/Neurodiagnostic studies   CBC: No results for input(s): WBC, NEUTROABS, HGB, HCT, MCV, PLT in the last 168 hours. Basic Metabolic Panel:  Lab Results  Component Value Date   NA 140 12/24/2023   K 4.0 12/24/2023   CO2 24 12/24/2023   GLUCOSE 93 12/24/2023   BUN 24 (H) 12/24/2023   CREATININE 1.29 (H) 12/24/2023   CALCIUM  9.3 12/24/2023   GFRNONAA 56 (L) 12/24/2023   Lipid Panel:  Lab Results  Component Value Date   LDLCALC 52 12/23/2023   HgbA1c:  Lab Results  Component Value Date   HGBA1C 5.4 12/23/2023   Urine Drug Screen: No results found for: LABOPIA, COCAINSCRNUR, LABBENZ, AMPHETMU, THCU, LABBARB  Alcohol Level     Component Value Date/Time   ETH <10 12/22/2023 0933   INR  Lab Results  Component Value Date   INR 1.4 (H) 12/22/2023   APTT  Lab Results  Component Value Date   APTT 32 12/22/2023   AED levels: No results found for: PHENYTOIN, ZONISAMIDE, LAMOTRIGINE, LEVETIRACETA  CT Head without contrast(Personally reviewed): ***  CT angio Head and Neck with contrast(Personally reviewed): ***  MR Angio head without contrast and Carotid Duplex BL(Personally reviewed): ***  MRI Brain(Personally reviewed): ***  Neurodiagnostics rEEG:  ***  ASSESSMENT   Adam Stephens is a 78 y.o. male ***  RECOMMENDATIONS  *** ______________________________________________________________________    Signed, Carl Butner, MD [facility]

## 2024-09-28 NOTE — ED Provider Notes (Signed)
 [facility] EMERGENCY DEPARTMENT AT [facility] Provider Note   CSN: [id number] Arrival date & time: 09/28/24  2247  An emergency department physician performed an initial assessment on this suspected stroke patient at 2250.  Patient presents with: Code Stroke   Adam Stephens is a 78 y.o. male.   Presents to the emergency department by ambulance from memory care unit.  Patient with altered level of consciousness, possible seizure-like activity prior to arrival in the ED.  EMS reports focal neurologic findings.       Prior to Admission medications   Medication Sig Start Date End Date Taking? Authorizing Provider  amiodarone  (PACERONE ) 200 MG tablet Take 1 tablet (200 mg total) by mouth daily. 12/10/23  Yes Christia Budds, MD  apixaban  (ELIQUIS ) 5 MG TABS tablet Take 5 mg by mouth 2 (two) times daily. 04/07/23  Yes [provider]  aspirin  EC 81 MG tablet Take 1 tablet (81 mg total) by mouth daily. Swallow whole. 12/24/23  Yes Janna Ferrier, DO  atorvastatin  (LIPITOR) 40 MG tablet Take 40 mg by mouth at bedtime.   Yes [provider]  famotidine  (PEPCID ) 20 MG tablet Take 1 tablet (20 mg total) by mouth daily. 12/24/23 09/29/25 Yes Janna Ferrier, DO  folic acid  (FOLVITE ) 1 MG tablet Take 0.5 tablets (0.5 mg total) by mouth daily. 12/24/23  Yes Gomes, Adriana, DO  GOODSENSE CLEARLAX 17 GM/SCOOP powder Take 17 g by mouth every evening. 08/08/23  Yes [provider]  GOODSENSE PAIN RELIEF EXTRA ST 500 MG tablet Take 1,000 mg by mouth in the morning and at bedtime. 09/25/24  Yes [provider]  melatonin 3 MG TABS tablet Take 1 tablet (3 mg total) by mouth at bedtime as needed. 12/24/23  Yes Janna Ferrier, DO  mirtazapine  (REMERON ) 7.5 MG tablet Take 7.5 mg by mouth at bedtime.   Yes [provider]  tamsulosin  (FLOMAX ) 0.4 MG CAPS capsule Take 0.4 mg by mouth at bedtime.   Yes [provider]    Allergies: Hydrochlorothiazide,  Norvasc [amlodipine], and Tekturna [aliskiren]    Review of Systems  Updated Vital Signs BP (!) 107/58   Pulse (!) 49   Temp 98.2 F (36.8 C) (Axillary)   Resp 13   Ht 5' 10 (1.778 m)   Wt 60.4 kg   SpO2 99%   BMI 19.11 kg/m   Physical Exam Constitutional:      Appearance: He is ill-appearing.  HENT:     Head: Atraumatic.  Cardiovascular:     Rate and Rhythm: Normal rate and regular rhythm.  Pulmonary:     Breath sounds: Normal breath sounds.  Abdominal:     Palpations: Abdomen is soft.  Musculoskeletal:        General: No swelling.  Skin:    General: Skin is warm.  Neurological:     Mental Status: He is lethargic.     (all labs ordered are listed, but only abnormal results are displayed) Labs Reviewed  PROTIME-INR - Abnormal; Notable for the following components:      Result Value   Prothrombin Time 16.5 (*)    INR 1.3 (*)    All other components within normal limits  CBC - Abnormal; Notable for the following components:   WBC 13.7 (*)    RBC 3.21 (*)    Hemoglobin 10.6 (*)    HCT 33.2 (*)    MCV 103.4 (*)    All other components within normal limits  DIFFERENTIAL -  Abnormal; Notable for the following components:   Neutro Abs 9.9 (*)    Abs Immature Granulocytes 0.08 (*)    All other components within normal limits  COMPREHENSIVE METABOLIC PANEL WITH GFR - Abnormal; Notable for the following components:   BUN 26 (*)    Creatinine, Ser 1.70 (*)    Total Protein 6.3 (*)    Albumin 2.9 (*)    GFR, Estimated 41 (*)    All other components within normal limits  URINALYSIS, ROUTINE W REFLEX MICROSCOPIC - Abnormal; Notable for the following components:   APPearance TURBID (*)    Hgb urine dipstick SMALL (*)    Protein, ur 30 (*)    Leukocytes,Ua LARGE (*)    Bacteria, UA MANY (*)    All other components within normal limits  I-STAT CHEM 8, ED - Abnormal; Notable for the following components:   BUN 27 (*)    Creatinine, Ser 2.00 (*)    Hemoglobin 10.9  (*)    HCT 32.0 (*)    All other components within normal limits  MRSA NEXT GEN BY PCR, NASAL  APTT  ETHANOL  CREATININE, SERUM  HEMOGLOBIN A1C  LIPID PANEL  CBG MONITORING, ED  I-STAT CG4 LACTIC ACID, ED    EKG: EKG Interpretation Date/Time:  Sunday September 28 2024 23:18:10 EST Ventricular Rate:  75 PR Interval:  196 QRS Duration:  102 QT Interval:  419 QTC Calculation: 468 R Axis:   59  Text Interpretation: Sinus rhythm Baseline wander in lead(s) V3 Confirmed by Haze Lonni PARAS [phone]) on 09/28/2024 11:34:25 PM  Radiology: CT HEAD POST STROKE FOLLOWUP/TIMED/STAT READ Result Date: 09/29/2024 EXAM: CT HEAD WITHOUT 09/29/2024 05:42:01 AM TECHNIQUE: CT of the head was performed without the administration of intravenous contrast. Automated exposure control, iterative reconstruction, and/or weight based adjustment of the mA/kV was utilized to reduce the radiation dose to as low as reasonably achievable. COMPARISON: Head CT and CTA head and neck 09/28/2024. Brain MRI 12/22/2023. CLINICAL HISTORY: 78 year old male. Code stroke presentation yesterday. Neuro deficit, acute, stroke suspected. Medial right thalamic hemorrhage with intraventricular extension. FINDINGS: BRAIN AND VENTRICLES: Small intraaxial hemorrhage at the junction of the right thalamus and midbrain, cerebral peduncle on series 2 image 21. Rounded intraaxial blood there estimated at 10 mm diameter (1 ml). Stable small to moderate volume of intraventricular extension of blood, mostly the 3rd ventricle, smaller volume scattered in the left lateral ventricle. Underlying chronic ventriculomegaly (nonspecific) is stable. Underlying advanced chronic small vessel disease redemonstrated including multifocal bilateral deep gray nuclei chronic hypodensity, confluent bilateral cerebral white matter hypodensity. Chronic heterogeneity in the brainstem. No acute intracranial mass effect or midline shift. No extra-axial fluid collection.  No evidence of acute infarct. No superimposed acute cortically based infarct by CT. No hydrocephalus. No new areas of intracranial hemorrhage. Calcified atherosclerosis at the skull base. No suspicious intracranial vascular hyperdensity. ORBITS: No acute abnormality. SINUSES AND MASTOIDS: Paranasal sinuses, middle ears and mastoids remain well aerated. SOFT TISSUES AND SKULL: No acute skull fracture. No acute soft tissue abnormality. Stable chronic left facial bone fractures. IMPRESSION: 1. No significant change in Right thalamic-midbrain junction parenchymal hemorrhage ( estimated 1 mL) and small-to-moderate volume intraventricular extension. 2. Underlying chronic nonspecific ventriculomegaly is stable. And underlying advanced chronic small vessel disease. 3. No new intracranial abnormality. Electronically signed by: Helayne Hurst MD 09/29/2024 05:53 AM EST RP Workstation: [id number]   CT ANGIO HEAD NECK W WO CM (CODE STROKE) Result Date: 09/28/2024 EXAM: CTA HEAD AND NECK WITH/WITH  AND WITHOUT _study_datetime_ TECHNIQUE: CTA of the head and neck was performed with/with and without the administration of intravenous contrast. Multiplanar 2D and/or 3D reformatted images are provided for review. Automated exposure control, iterative reconstruction, and/or weight based adjustment of the mA/kV was utilized to reduce the radiation dose to as low as reasonably achievable. Stenosis of the internal carotid arteries measured using NASCET criteria. ## Instructions (for model only, do not include in report) TASK: If findings are described in the transcript for a unilateral specific cervical artery (e.g. left internal carotid artery or ICA), please do the following: - Keep negative statements for other vessels in the relevant section. - Modify the relevant negative statement to reflect that the contralateral artery (e.g. right ICA) is patent without significant stenosis or dissection. TASK: If findings are described in the  transcript for a unilateral specific intracranial artery (e.g. left middle cerebral artery or left MCA) in the transcript, please do the following: - Keep negative statements for other vessels in the relevant section. - Modify the relevant negative statement to reflect that the contralateral artery (e.g. right MCA) is patent proximally without significant stenosis. COMPARISON: None available CLINICAL HISTORY: FINDINGS: CTA NECK: AORTIC ARCH AND ARCH VESSELS: No dissection or arterial injury. No significant stenosis of the brachiocephalic or subclavian arteries. CERVICAL CAROTID ARTERIES: Atherosclerosis of the right carotid bifurcation with approximately 70% stenosis of the ICA origin. No significant (Greater than 50%) stenosis of the left carotid. CERVICAL VERTEBRAL ARTERIES: No dissection, arterial injury, or significant stenosis. LUNGS AND MEDIASTINUM: Unremarkable. SOFT TISSUES: No acute abnormality. BONES: No acute abnormality. CTA HEAD: ANTERIOR CIRCULATION: No significant stenosis of the internal carotid arteries. No significant stenosis of the anterior cerebral arteries. 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 No aneurysm. POSTERIOR CIRCULATION: No significant stenosis of the posterior cerebral arteries. Moderate right and mild left P2 PCA stenosis. No significant stenosis of the basilar artery. No significant stenosis of the vertebral arteries. No aneurysm. OTHER: Dural venous sinuses are not well evaluated. IMPRESSION: 1. No large vessel occlusion. 2. No evidence of aneurysm. 3. Approximately 70% stenosis of the ICA origin. 4. 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 5. Moderate right and mild left P2 PCA stenosis. Electronically signed by: Gilmore Molt MD 09/28/2024 11:28 PM EST RP Workstation: [id number]   CT HEAD CODE STROKE WO CONTRAST Result Date: 09/28/2024 EXAM: CT HEAD WITHOUT CONTRAST 09/28/2024 11:04:56 PM TECHNIQUE: CT of the head was performed without the  administration of intravenous contrast. Automated exposure control, iterative reconstruction, and/or weight based adjustment of the mA/kV was utilized to reduce the radiation dose to as low as reasonably achievable. COMPARISON: CT head 12/22/2023 CLINICAL HISTORY: Neuro deficit, acute, stroke suspected. FINDINGS: BRAIN AND VENTRICLES: Acute 1 cm intraparenchymal hemorrhage in the medial right thalamus with intraventricular extension into the left lateral and third ventricles. Chronic ventriculomegaly and cerebral atrophy is unchanged. Remote bilateral deep gray nuclei lacunar infarcts. No extra-axial collection. No mass effect or midline shift. Patchy white matter hypodensities, nonspecific but compatible with chronic vascular ischemic disease. ORBITS: No acute abnormality. SINUSES: No acute abnormality. SOFT TISSUES AND SKULL: No acute soft tissue abnormality. No skull fracture. Findings discussed with Dr. Sal via telephone at 11:07 PM IMPRESSION: 1. Acute 1 cm intraparenchymal hemorrhage in the medial right thalamus with intraventricular extension into the left lateral and third ventricles. 2. Chronic ventriculomegaly and cerebral atrophy is unchanged. 3. Chronic microvascular ischemic change without evidence of acute large vascular territory infarct. ASPECTS 10. Electronically  signed by: Gilmore Molt MD 09/28/2024 11:12 PM EST RP Workstation: [id number]     Procedures   Medications Ordered in the ED   stroke: early stages of recovery book (has no administration in time range)  acetaminophen  (TYLENOL ) tablet 650 mg (has no administration in time range)    Or  acetaminophen  (TYLENOL ) 160 MG/5ML solution 650 mg (has no administration in time range)    Or  acetaminophen  (TYLENOL ) suppository 650 mg (has no administration in time range)  senna-docusate (Senokot-S) tablet 1 tablet (has no administration in time range)  pantoprazole (PROTONIX) injection 40 mg (40 mg Intravenous Given 09/29/24 0102)   clevidipine (CLEVIPREX) infusion 0.5 mg/mL (has no administration in time range)  Chlorhexidine Gluconate Cloth 2 % PADS 6 each (has no administration in time range)  Oral care mouth rinse (has no administration in time range)  0.9 %  sodium chloride  infusion ( Intravenous Stopped 09/29/24 0327)  vancomycin (VANCOREADY) IVPB 1250 mg/250 mL (has no administration in time range)  piperacillin-tazobactam (ZOSYN) IVPB 3.375 g (has no administration in time range)  atropine 1 MG/10ML injection (has no administration in time range)  atropine 1 MG/10ML injection 1 mg (has no administration in time range)  sodium chloride  flush (NS) 0.9 % injection 3 mL (3 mLs Intravenous Given 09/28/24 2307)  levETIRAcetam (KEPPRA) undiluted injection 3,500 mg (3,500 mg Intravenous Given 09/28/24 2308)  coag fact Xa recombinant (ANDEXXA) low dose infusion 900 mg (900 mg Intravenous New Bag/Given 09/28/24 2347)  iohexol  (OMNIPAQUE ) 350 MG/ML injection 75 mL (75 mLs Intravenous Contrast Given 09/28/24 2313)  hydrALAZINE (APRESOLINE) injection 20 mg (20 mg Intravenous Given 09/28/24 2312)  vancomycin (VANCOREADY) IVPB 1250 mg/250 mL (0 mg Intravenous Stopped 09/29/24 0339)  piperacillin-tazobactam (ZOSYN) IVPB 3.375 g (0 g Intravenous Stopped 09/29/24 0159)  sodium chloride  0.9 % bolus 1,000 mL ( Intravenous Infusion Verify 09/29/24 0449)  sodium chloride  0.9 % bolus 1,000 mL (1,000 mLs Intravenous New Bag/Given 09/29/24 0504)                                    Medical Decision Making Amount and/or Complexity of Data Reviewed Labs: ordered. Decision-making details documented in ED Course. Radiology: ordered and independent interpretation performed. Decision-making details documented in ED Course.  Risk Decision regarding hospitalization.   Differential diagnosis considered includes, but not limited to:  TIA; Stroke; seizure; Complex Migraine; metabolic encephalopathy  Presents as a code stroke.  Patient  evaluated alongside neurology.  Patient brought immediately to CT scan for imaging and plain head CT shows right thalamic hemorrhage.  Patient stable, to be admitted by neurology service.  Patient with mild leukocytosis, recommend checking urinalysis for possible infection, treat if present.  BUN and creatinine elevated, initial treatment to be hydration.  CRITICAL CARE Performed by: Lonni JINNY Seats   Total critical care time: 30 minutes  Critical care time was exclusive of separately billable procedures and treating other patients.  Critical care was necessary to treat or prevent imminent or life-threatening deterioration.  Critical care was time spent personally by me on the following activities: development of treatment plan with patient and/or surrogate as well as nursing, discussions with consultants, evaluation of patient's response to treatment, examination of patient, obtaining history from patient or surrogate, ordering and performing treatments and interventions, ordering and review of laboratory studies, ordering and review of radiographic studies, pulse oximetry and re-evaluation of patient's condition.  Final diagnoses:  Right-sided nontraumatic intracerebral hemorrhage, unspecified cerebral location [facility])    ED Discharge Orders     None          Haze Lonni PARAS, MD 09/29/24 [phone]

## 2024-09-28 NOTE — ED Triage Notes (Signed)
 Pt BIB [facility] as an activated code stroke. Pt coming from [facility] called out for seizure like activity lasting approx. 8 minutes. No Hx of seizures. 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 Baseline ambulatory and alert to self. Takes Eliquis .  EMS: NS 40 HR initially 1mg  Atropine  60 HR 170/70 CBG 119

## 2024-09-28 NOTE — ED Provider Notes (Incomplete)
 [place] EMERGENCY DEPARTMENT AT [facility] Provider Note   CSN: [id number] Arrival date & time: 09/28/24  2247  An emergency department physician performed an initial assessment on this suspected stroke patient at 2250.  Patient presents with: Code Stroke   Adam BUSBEE is a 78 y.o. male.  {Add pertinent medical, surgical, social history, OB history to YEP:67052} Presents to the emergency department by ambulance from memory care unit.  Patient with altered level of consciousness, possible seizure-like activity prior to arrival in the ED.  EMS reports focal neurologic findings.       Prior to Admission medications   Medication Sig Start Date End Date Taking? Authorizing Provider  amiodarone  (PACERONE ) 200 MG tablet Take 1 tablet (200 mg total) by mouth daily. 12/10/23   Christia Budds, MD  apixaban  (ELIQUIS ) 5 MG TABS tablet Take 5 mg by mouth 2 (two) times daily. 04/07/23   [provider]  aspirin  EC 81 MG tablet Take 1 tablet (81 mg total) by mouth daily. Swallow whole. 12/24/23   Gomes, Adriana, DO  atorvastatin  (LIPITOR) 40 MG tablet Take 40 mg by mouth at bedtime.    [provider]  famotidine  (PEPCID ) 20 MG tablet Take 1 tablet (20 mg total) by mouth daily. 12/24/23 01/23/24  Gomes, Adriana, DO  folic acid  (FOLVITE ) 1 MG tablet Take 0.5 tablets (0.5 mg total) by mouth daily. 12/24/23   Gomes, Adriana, DO  GOODSENSE CLEARLAX 17 GM/SCOOP powder Take 17 g by mouth every evening. 08/08/23   [provider]  melatonin 3 MG TABS tablet Take 1 tablet (3 mg total) by mouth at bedtime as needed. 12/24/23   Gomes, Adriana, DO  mirtazapine  (REMERON ) 7.5 MG tablet Take 7.5 mg by mouth at bedtime.    [provider]  tamsulosin  (FLOMAX ) 0.4 MG CAPS capsule Take 0.4 mg by mouth at bedtime.    [provider]    Allergies: Hydrochlorothiazide, Norvasc [amlodipine], and Tekturna [aliskiren]    Review of Systems  Updated Vital Signs BP  126/69   Pulse 88   Temp (!) 97.2 F (36.2 C) (Tympanic)   Resp 17   Wt 62.2 kg   SpO2 100%   BMI 19.68 kg/m   Physical Exam Constitutional:      Appearance: He is ill-appearing.  HENT:     Head: Atraumatic.  Cardiovascular:     Rate and Rhythm: Normal rate and regular rhythm.  Pulmonary:     Breath sounds: Normal breath sounds.  Abdominal:     Palpations: Abdomen is soft.  Musculoskeletal:        General: No swelling.  Skin:    General: Skin is warm.  Neurological:     Mental Status: He is lethargic.     (all labs ordered are listed, but only abnormal results are displayed) Labs Reviewed  PROTIME-INR - Abnormal; Notable for the following components:      Result Value   Prothrombin Time 16.5 (*)    INR 1.3 (*)    All other components within normal limits  CBC - Abnormal; Notable for the following components:   WBC 13.7 (*)    RBC 3.21 (*)    Hemoglobin 10.6 (*)    HCT 33.2 (*)    MCV 103.4 (*)    All other components within normal limits  DIFFERENTIAL - Abnormal; Notable for the following components:   Neutro Abs 9.9 (*)    Abs Immature Granulocytes 0.08 (*)    All other  components within normal limits  COMPREHENSIVE METABOLIC PANEL WITH GFR - Abnormal; Notable for the following components:   BUN 26 (*)    Creatinine, Ser 1.70 (*)    Total Protein 6.3 (*)    Albumin 2.9 (*)    GFR, Estimated 41 (*)    All other components within normal limits  I-STAT CHEM 8, ED - Abnormal; Notable for the following components:   BUN 27 (*)    Creatinine, Ser 2.00 (*)    Hemoglobin 10.9 (*)    HCT 32.0 (*)    All other components within normal limits  APTT  ETHANOL  CBG MONITORING, ED  I-STAT CG4 LACTIC ACID, ED    EKG: EKG Interpretation Date/Time:  Sunday September 28 2024 23:18:10 EST Ventricular Rate:  75 PR Interval:  196 QRS Duration:  102 QT Interval:  419 QTC Calculation: 468 R Axis:   59  Text Interpretation: Sinus rhythm Baseline wander in lead(s)  V3 Confirmed by Haze Lonni PARAS [phone]) on 09/28/2024 11:34:25 PM  Radiology: CT ANGIO HEAD NECK W WO CM (CODE STROKE) Result Date: 09/28/2024 EXAM: CTA HEAD AND NECK WITH/WITH AND WITHOUT _study_datetime_ TECHNIQUE: CTA of the head and neck was performed with/with and without the administration of intravenous contrast. Multiplanar 2D and/or 3D reformatted images are provided for review. Automated exposure control, iterative reconstruction, and/or weight based adjustment of the mA/kV was utilized to reduce the radiation dose to as low as reasonably achievable. Stenosis of the internal carotid arteries measured using NASCET criteria. ## Instructions (for model only, do not include in report) TASK: If findings are described in the transcript for a unilateral specific cervical artery (e.g. left internal carotid artery or ICA), please do the following: - Keep negative statements for other vessels in the relevant section. - Modify the relevant negative statement to reflect that the contralateral artery (e.g. right ICA) is patent without significant stenosis or dissection. TASK: If findings are described in the transcript for a unilateral specific intracranial artery (e.g. left middle cerebral artery or left MCA) in the transcript, please do the following: - Keep negative statements for other vessels in the relevant section. - Modify the relevant negative statement to reflect that the contralateral artery (e.g. right MCA) is patent proximally without significant stenosis. COMPARISON: None available CLINICAL HISTORY: FINDINGS: CTA NECK: AORTIC ARCH AND ARCH VESSELS: No dissection or arterial injury. No significant stenosis of the brachiocephalic or subclavian arteries. CERVICAL CAROTID ARTERIES: Atherosclerosis of the right carotid bifurcation with approximately 70% stenosis of the ICA origin. No significant (Greater than 50%) stenosis of the left carotid. CERVICAL VERTEBRAL ARTERIES: No dissection, arterial  injury, or significant stenosis. LUNGS AND MEDIASTINUM: Unremarkable. SOFT TISSUES: No acute abnormality. BONES: No acute abnormality. CTA HEAD: ANTERIOR CIRCULATION: No significant stenosis of the internal carotid arteries. No significant stenosis of the anterior cerebral arteries. 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 No aneurysm. POSTERIOR CIRCULATION: No significant stenosis of the posterior cerebral arteries. Moderate right and mild left P2 PCA stenosis. No significant stenosis of the basilar artery. No significant stenosis of the vertebral arteries. No aneurysm. OTHER: Dural venous sinuses are not well evaluated. IMPRESSION: 1. No large vessel occlusion. 2. No evidence of aneurysm. 3. Approximately 70% stenosis of the ICA origin. 4. 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 5. Moderate right and mild left P2 PCA stenosis. Electronically signed by: Gilmore Molt MD 09/28/2024 11:28 PM EST RP Workstation: [id number]   CT HEAD CODE STROKE WO CONTRAST  Result Date: 09/28/2024 EXAM: CT HEAD WITHOUT CONTRAST 09/28/2024 11:04:56 PM TECHNIQUE: CT of the head was performed without the administration of intravenous contrast. Automated exposure control, iterative reconstruction, and/or weight based adjustment of the mA/kV was utilized to reduce the radiation dose to as low as reasonably achievable. COMPARISON: CT head 12/22/2023 CLINICAL HISTORY: Neuro deficit, acute, stroke suspected. FINDINGS: BRAIN AND VENTRICLES: Acute 1 cm intraparenchymal hemorrhage in the medial right thalamus with intraventricular extension into the left lateral and third ventricles. Chronic ventriculomegaly and cerebral atrophy is unchanged. Remote bilateral deep gray nuclei lacunar infarcts. No extra-axial collection. No mass effect or midline shift. Patchy white matter hypodensities, nonspecific but compatible with chronic vascular ischemic disease. ORBITS: No acute abnormality. SINUSES: No acute  abnormality. SOFT TISSUES AND SKULL: No acute soft tissue abnormality. No skull fracture. Findings discussed with Dr. Sal via telephone at 11:07 PM IMPRESSION: 1. Acute 1 cm intraparenchymal hemorrhage in the medial right thalamus with intraventricular extension into the left lateral and third ventricles. 2. Chronic ventriculomegaly and cerebral atrophy is unchanged. 3. Chronic microvascular ischemic change without evidence of acute large vascular territory infarct. ASPECTS 10. Electronically signed by: Gilmore Molt MD 09/28/2024 11:12 PM EST RP Workstation: [id number]    {Document cardiac monitor, telemetry assessment procedure when appropriate:32947} Procedures   Medications Ordered in the ED  coag fact Xa recombinant (ANDEXXA) low dose infusion 900 mg (has no administration in time range)   stroke: early stages of recovery book (has no administration in time range)  acetaminophen  (TYLENOL ) tablet 650 mg (has no administration in time range)    Or  acetaminophen  (TYLENOL ) 160 MG/5ML solution 650 mg (has no administration in time range)    Or  acetaminophen  (TYLENOL ) suppository 650 mg (has no administration in time range)  senna-docusate (Senokot-S) tablet 1 tablet (has no administration in time range)  pantoprazole (PROTONIX) injection 40 mg (has no administration in time range)  clevidipine (CLEVIPREX) infusion 0.5 mg/mL (has no administration in time range)  sodium chloride  flush (NS) 0.9 % injection 3 mL (3 mLs Intravenous Given 09/28/24 2307)  levETIRAcetam (KEPPRA) undiluted injection 3,500 mg (3,500 mg Intravenous Given 09/28/24 2308)  iohexol  (OMNIPAQUE ) 350 MG/ML injection 75 mL (75 mLs Intravenous Contrast Given 09/28/24 2313)  hydrALAZINE (APRESOLINE) injection 20 mg (20 mg Intravenous Given 09/28/24 2312)      {Click here for ABCD2, HEART and other calculators REFRESH Note before signing:1}                              Medical Decision Making Amount and/or Complexity of Data  Reviewed Labs: ordered. Decision-making details documented in ED Course. Radiology: ordered and independent interpretation performed. Decision-making details documented in ED Course.  Risk Decision regarding hospitalization.   Differential diagnosis considered includes, but not limited to:  TIA; Stroke; seizure; Complex Migraine; metabolic encephalopathy  Presents as a code stroke.  Patient evaluated alongside neurology.  Patient brought immediately to CT scan for imaging and plain head CT shows right thalamic hemorrhage.  {Document critical care time when appropriate  Document review of labs and clinical decision tools ie CHADS2VASC2, etc  Document your independent review of radiology images and any outside records  Document your discussion with family members, caretakers and with consultants  Document social determinants of health affecting pt's care  Document your decision making why or why not admission, treatments were needed:32947:::1}   Final diagnoses:  None    ED Discharge Orders  None

## 2024-09-29 ENCOUNTER — Encounter (HOSPITAL_COMMUNITY): Payer: Self-pay | Admitting: Neurology

## 2024-09-29 ENCOUNTER — Inpatient Hospital Stay (HOSPITAL_COMMUNITY)

## 2024-09-29 DIAGNOSIS — R4182 Altered mental status, unspecified: Secondary | ICD-10-CM

## 2024-09-29 DIAGNOSIS — I615 Nontraumatic intracerebral hemorrhage, intraventricular: Secondary | ICD-10-CM | POA: Diagnosis not present

## 2024-09-29 DIAGNOSIS — R0681 Apnea, not elsewhere classified: Secondary | ICD-10-CM

## 2024-09-29 DIAGNOSIS — I69391 Dysphagia following cerebral infarction: Secondary | ICD-10-CM

## 2024-09-29 DIAGNOSIS — T45515A Adverse effect of anticoagulants, initial encounter: Secondary | ICD-10-CM | POA: Diagnosis not present

## 2024-09-29 DIAGNOSIS — R001 Bradycardia, unspecified: Secondary | ICD-10-CM

## 2024-09-29 DIAGNOSIS — I619 Nontraumatic intracerebral hemorrhage, unspecified: Secondary | ICD-10-CM | POA: Diagnosis not present

## 2024-09-29 DIAGNOSIS — N183 Chronic kidney disease, stage 3 unspecified: Secondary | ICD-10-CM

## 2024-09-29 DIAGNOSIS — A419 Sepsis, unspecified organism: Secondary | ICD-10-CM

## 2024-09-29 DIAGNOSIS — I129 Hypertensive chronic kidney disease with stage 1 through stage 4 chronic kidney disease, or unspecified chronic kidney disease: Secondary | ICD-10-CM | POA: Diagnosis not present

## 2024-09-29 DIAGNOSIS — I610 Nontraumatic intracerebral hemorrhage in hemisphere, subcortical: Secondary | ICD-10-CM | POA: Diagnosis not present

## 2024-09-29 LAB — CBC
HCT: 35 % — ABNORMAL LOW (ref 39.0–52.0)
Hemoglobin: 11.4 g/dL — ABNORMAL LOW (ref 13.0–17.0)
MCH: 33.5 pg (ref 26.0–34.0)
MCHC: 32.6 g/dL (ref 30.0–36.0)
MCV: 102.9 fL — ABNORMAL HIGH (ref 80.0–100.0)
Platelets: 355 K/uL (ref 150–400)
RBC: 3.4 MIL/uL — ABNORMAL LOW (ref 4.22–5.81)
RDW: 14.7 % (ref 11.5–15.5)
WBC: 15.7 K/uL — ABNORMAL HIGH (ref 4.0–10.5)
nRBC: 0 % (ref 0.0–0.2)

## 2024-09-29 LAB — BASIC METABOLIC PANEL WITH GFR
Anion gap: 14 (ref 5–15)
BUN: 22 mg/dL (ref 8–23)
CO2: 18 mmol/L — ABNORMAL LOW (ref 22–32)
Calcium: 8.9 mg/dL (ref 8.9–10.3)
Chloride: 108 mmol/L (ref 98–111)
Creatinine, Ser: 1.57 mg/dL — ABNORMAL HIGH (ref 0.61–1.24)
GFR, Estimated: 45 mL/min — ABNORMAL LOW (ref >60)
Glucose, Bld: 73 mg/dL (ref 70–99)
Potassium: 4.1 mmol/L (ref 3.5–5.1)
Sodium: 140 mmol/L (ref 135–145)

## 2024-09-29 LAB — LIPID PANEL
Cholesterol: 113 mg/dL (ref 0–200)
HDL: 47 mg/dL (ref >40)
LDL Cholesterol: 51 mg/dL (ref 0–99)
Total CHOL/HDL Ratio: 2.4 ratio
Triglycerides: 74 mg/dL (ref <150)
VLDL: 15 mg/dL (ref 0–40)

## 2024-09-29 LAB — CREATININE, SERUM
Creatinine, Ser: 1.65 mg/dL — ABNORMAL HIGH (ref 0.61–1.24)
GFR, Estimated: 42 mL/min — ABNORMAL LOW (ref >60)

## 2024-09-29 LAB — HEMOGLOBIN A1C
Hgb A1c MFr Bld: 5.2 % (ref 4.8–5.6)
Mean Plasma Glucose: 102.54 mg/dL

## 2024-09-29 LAB — GLUCOSE, CAPILLARY
Glucose-Capillary: 92 mg/dL (ref 70–99)
Glucose-Capillary: 81 mg/dL (ref 70–99)

## 2024-09-29 LAB — MRSA NEXT GEN BY PCR, NASAL: MRSA by PCR Next Gen: NOT DETECTED

## 2024-09-29 MED ORDER — SODIUM CHLORIDE 0.9 % IV SOLN: INTRAVENOUS | Status: AC

## 2024-09-29 MED ORDER — SODIUM CHLORIDE 0.9 % IV BOLUS
1000.0000 mL | Freq: Once | INTRAVENOUS | Status: AC
  Administered 2024-09-29: 1000 mL via INTRAVENOUS

## 2024-09-29 MED ORDER — ORAL CARE MOUTH RINSE
15.0000 mL | OROMUCOSAL | Status: DC | PRN
  Administered 2024-09-29: 15 mL via OROMUCOSAL

## 2024-09-29 MED ORDER — VANCOMYCIN HCL 1250 MG/250ML IV SOLN: 1250.0000 mg | INTRAVENOUS | Status: DC

## 2024-09-29 MED ORDER — CHLORHEXIDINE GLUCONATE CLOTH 2 % EX PADS
6.0000 | MEDICATED_PAD | Freq: Every day | CUTANEOUS | Status: DC
  Administered 2024-09-29 – 2024-10-02 (×4): 6 via TOPICAL

## 2024-09-29 MED ORDER — ATROPINE SULFATE 1 MG/10ML IJ SOSY
PREFILLED_SYRINGE | INTRAMUSCULAR | Status: AC
  Filled 2024-09-29: qty 10

## 2024-09-29 MED ORDER — VANCOMYCIN HCL 1250 MG/250ML IV SOLN
1250.0000 mg | Freq: Once | INTRAVENOUS | Status: AC
  Administered 2024-09-29: 1250 mg via INTRAVENOUS
  Filled 2024-09-29: qty 250

## 2024-09-29 MED ORDER — PIPERACILLIN-TAZOBACTAM 3.375 G IVPB
3.3750 g | Freq: Three times a day (TID) | INTRAVENOUS | Status: DC
  Administered 2024-09-29: 3.375 g via INTRAVENOUS
  Filled 2024-09-29: qty 50

## 2024-09-29 MED ORDER — PIPERACILLIN-TAZOBACTAM 3.375 G IVPB 30 MIN
3.3750 g | Freq: Once | INTRAVENOUS | Status: AC
  Administered 2024-09-29: 3.375 g via INTRAVENOUS
  Filled 2024-09-29 (×2): qty 50

## 2024-09-29 MED ORDER — ATROPINE SULFATE 1 MG/10ML IJ SOSY: 1.0000 mg | PREFILLED_SYRINGE | INTRAMUSCULAR | Status: DC | PRN

## 2024-09-29 MED ORDER — SODIUM CHLORIDE 0.9 % IV SOLN
2.0000 g | INTRAVENOUS | Status: DC
  Administered 2024-09-29: 2 g via INTRAVENOUS
  Filled 2024-09-29: qty 20

## 2024-09-29 NOTE — Progress Notes (Signed)
 PT Cancellation Note  Patient Details Name: Adam Stephens MRN: [id number] DOB: 04-17-1946   Cancelled Treatment:    Reason Eval/Treat Not Completed: Active bedrest order  Valmore Arabie S, PT DPT Acute Rehabilitation Services Secure Chat Preferred  Office [phone]  Zac Torti E Stroup 09/29/2024, 11:47 AM

## 2024-09-29 NOTE — Progress Notes (Incomplete)
 Contact Sal MD. For SBP <100's and HR in 30-40's, MD gave verbal order to have atropine at bedside. Bolus ordered, given with minimal improvement. Elink RN notified to clarify code status and administration of atropine and told to hold at this time.

## 2024-09-29 NOTE — Progress Notes (Signed)
 Pharmacy Antibiotic Note  Adam Stephens is a 78 y.o. male admitted on 09/28/2024 with ICH and complicated UTI.  Pharmacy has been consulted for vancomycin and Zosyn dosing.  Plan: Vancomycin 1250mg  IV Q48H. Goal AUC 400-550.  Expected AUC 500.   Zosyn 3.375g IV Q8H (4-hour infusion).  Weight: 60.4 kg (133 lb 2.5 oz)  Temp (24hrs), Avg:97.9 F (36.6 C), Min:97.2 F (36.2 C), Max:98.5 F (36.9 C)  Recent Labs  Lab 09/28/24 2253 09/28/24 2257 09/28/24 2258  WBC 13.7*  --   --   CREATININE 1.70* 2.00*  --   LATICACIDVEN  --   --  0.6       Allergies  Allergen Reactions   Hydrochlorothiazide Other (See Comments)    Unknown reaction   Norvasc [Amlodipine] Other (See Comments)    Unknown reaction   Tekturna [Aliskiren] Other (See Comments)    Unknown reaction    Thank you for allowing pharmacy to be a part of this patient's care.  Marvetta Dauphin, PharmD, BCPS  09/29/2024 1:19 AM

## 2024-09-29 NOTE — Progress Notes (Signed)
 SLP Cancellation Note  Patient Details Name: BROOKS KINNAN MRN: [id number] DOB: Aug 23, 1946   Cancelled treatment:       Reason Eval/Treat Not Completed: Patient not medically ready. Discussed with RN, who recommends SLP hold evaluation until next date. He reports pt is only responsive to pain. Will continue following.    Damien Blumenthal, M.A., CCC-SLP Speech Language Pathology, Acute Rehabilitation Services  Secure Chat preferred [phone]  09/29/2024, 10:00 AM

## 2024-09-29 NOTE — Progress Notes (Signed)
 09/29/2024 Patient appears comfortable. EEG being hooked up. Agree with plan as outlined by Dr. Maree.

## 2024-09-29 NOTE — Progress Notes (Signed)
 LTM EEG hooked up and running - no initial skin breakdown - push button tested - [facility] monitoring. MRI compatible leads used.

## 2024-09-29 NOTE — Code Documentation (Signed)
 Stroke Response Nurse Documentation Code Documentation  JUDGE DUQUE is a 78 y.o. male arriving to [facility]  via [facility] on 11/9 with past medical hx of HTN, HLD, afibb . On Eliquis  (apixaban ) daily. Code stroke was activated by EMS.   Patient from SNF where he was LKW at 2100 and now complaining of seizure and left sided weakness.   Stroke team at the bedside on patient arrival. Labs drawn and patient cleared for CT by Dr. Donne. Patient to CT with team. NIHSS 30, see documentation for details and code stroke times. Patient with decreased LOC, disoriented, not following commands, left hemianopia, left facial droop, left arm weakness, bilateral leg weakness, left decreased sensation, Global aphasia , and dysarthria  on exam. The following imaging was completed:  CT Head and CTA. Patient is not a candidate for IV Thrombolytic due to ICH. Patient is not a candidate for IR due to ICH/poor baseline.   Care Plan: Neuro checks q1 hr.    Bedside handoff with ED RN Mancel.    Griselda Alm ORN  Rapid Response RN

## 2024-09-29 NOTE — Consult Note (Signed)
 NAME:  Adam Stephens, MRN:  [id number], DOB:  [birth year], LOS: 1 ADMISSION DATE:  09/28/2024, CONSULTATION DATE:  09/29/2024 REFERRING MD:  Ellouise Mari, MD, CHIEF COMPLAINT:  Hypotension and bradycardia   History of Present Illness:  78 y/o male with PMH for HTN, HLD, A fib on Eliquis  presents from NH with AMS and seizing, BIBEMS to ED and was a code stroke.  He was found to have ICH, maintaining his airways and anticoagulation revereed and he was loaded with Keppra.  PCCM called as he has been persistently hypotensive and bradycardia to HR high 30's.  UA consistent with UTI.  Pertinent  Medical History  HTN, HLD, A fib on Eliquis    Significant Hospital Events: Including procedures, antibiotic start and stop dates in addition to other pertinent events   11/9: neuro-icu admit  Interim History / Subjective:  N/a  Objective    Blood pressure (!) 107/58, pulse (!) 49, temperature 98.2 F (36.8 C), temperature source Axillary, resp. rate 13, height 5' 10 (1.778 m), weight 60.4 kg, SpO2 99%.        Intake/Output Summary (Last 24 hours) at 09/29/2024 0531 Last data filed at 09/29/2024 0449 Gross per 24 hour  Intake 7123.99 ml  Output 500 ml  Net 6623.99 ml   Filed Weights   09/28/24 2200 09/29/24 0016  Weight: 62.2 kg 60.4 kg    Examination: General: NAD, not alert and non-verbal HENT: [place]/at, dry mm Lungs: CTA no wheezes rales or rhonchi Cardiovascular: reg, brady, no murmurs Abdomen: soft nt nd bs pos Extremities: no cyanosis, clubbing or edema Neuro: moving to pain, all extremities, moves left arm least, non-verbal eyes closed, right pupil larger than left, right pupil non-reactive, left sluggish GU: n/a  Resolved problem list   Assessment and Plan  ICH Neurology Primary Anticoagulation reversed Repeat CT head now Hypotension Sepsis driving vs dehydration Responding well to 1 liter fluid bolus MAPs now 73, with IV fluids No role for IV pressors right now,  peripheral Levophed if MAPs drop below 65 Sepsis On Zosyn for UTI Continue with IV fluids, can have second fluid bolus, per sepsis protocol UTI Continue with antibiotics AMS From combination of CVA and UTI Bradycardia Driven by apnea Avoid B-Blockers or AV nodal blocking agents Currently he is in a sinus bradycardia which does not seem to correlate at all with his BP, but rather apneic events Atropine 0.5mg  IVP if HR drops below 30 Apnea He is having more and more repeated apnea events which is driving the HR down.  He has an apneic event and then in a timely manner the HR drops, he gets out of the apnea and his HR goes back into the high 40's to low 50's CPAP or BiPAP relative contraindicated secondary to his AMS and not being alert, though clearly protecting his airway, so could be considered if events are getting more frequent and/or effecting his hemodynamics adversely   Labs   CBC: Recent Labs  Lab 09/28/24 2253 09/28/24 2257  WBC 13.7*  --   NEUTROABS 9.9*  --   HGB 10.6* 10.9*  HCT 33.2* 32.0*  MCV 103.4*  --   PLT 333  --     Basic Metabolic Panel: Recent Labs  Lab 09/28/24 2253 09/28/24 2257  NA 136 138  K 4.7 4.8  CL 103 105  CO2 23  --   GLUCOSE 97 87  BUN 26* 27*  CREATININE 1.70* 2.00*  CALCIUM  9.0  --    GFR:  Estimated Creatinine Clearance: 25.6 mL/min (A) (by C-G formula based on SCr of 2 mg/dL (H)). Recent Labs  Lab 09/28/24 2253 09/28/24 2258  WBC 13.7*  --   LATICACIDVEN  --  0.6    Liver Function Tests: Recent Labs  Lab 09/28/24 2253  AST 15  ALT 10  ALKPHOS 63  BILITOT 0.3  PROT 6.3*  ALBUMIN 2.9*   No results for input(s): LIPASE, AMYLASE in the last 168 hours. No results for input(s): AMMONIA in the last 168 hours.  ABG    Component Value Date/Time   TCO2 25 09/28/2024 2257     Coagulation Profile: Recent Labs  Lab 09/28/24 2253  INR 1.3*    Cardiac Enzymes: No results for input(s): CKTOTAL, CKMB,  CKMBINDEX, TROPONINI in the last 168 hours.  HbA1C: Hgb A1c MFr Bld  Date/Time Value Ref Range Status  12/23/2023 06:36 AM 5.4 4.8 - 5.6 % Final    Comment:    (NOTE) Pre diabetes:          5.7%-6.4%  Diabetes:              >6.4%  Glycemic control for   <7.0% adults with diabetes     CBG: Recent Labs  Lab 09/28/24 2249  GLUCAP 83    Review of Systems:   AMS not able to provide ROS  Past Medical History:  He,  has a past medical history of High cholesterol and Hypertension.   Surgical History:  No past surgical history on file.   Social History:      Family History:  His family history is not on file.   Allergies Allergies  Allergen Reactions   Hydrochlorothiazide Other (See Comments)    Unknown reaction   Norvasc [Amlodipine] Other (See Comments)    Unknown reaction   Tekturna [Aliskiren] Other (See Comments)    Unknown reaction     Home Medications  Prior to Admission medications   Medication Sig Start Date End Date Taking? Authorizing Provider  amiodarone  (PACERONE ) 200 MG tablet Take 1 tablet (200 mg total) by mouth daily. 12/10/23  Yes Christia Budds, MD  apixaban  (ELIQUIS ) 5 MG TABS tablet Take 5 mg by mouth 2 (two) times daily. 04/07/23  Yes [provider]  aspirin  EC 81 MG tablet Take 1 tablet (81 mg total) by mouth daily. Swallow whole. 12/24/23  Yes Janna Ferrier, DO  atorvastatin  (LIPITOR) 40 MG tablet Take 40 mg by mouth at bedtime.   Yes [provider]  famotidine  (PEPCID ) 20 MG tablet Take 1 tablet (20 mg total) by mouth daily. 12/24/23 09/29/25 Yes Gomes, Adriana, DO  folic acid  (FOLVITE ) 1 MG tablet Take 0.5 tablets (0.5 mg total) by mouth daily. 12/24/23  Yes Gomes, Adriana, DO  GOODSENSE CLEARLAX 17 GM/SCOOP powder Take 17 g by mouth every evening. 08/08/23  Yes [provider]  GOODSENSE PAIN RELIEF EXTRA ST 500 MG tablet Take 1,000 mg by mouth in the morning and at bedtime. 09/25/24  Yes [provider]   melatonin 3 MG TABS tablet Take 1 tablet (3 mg total) by mouth at bedtime as needed. 12/24/23  Yes Gomes, Adriana, DO  mirtazapine  (REMERON ) 7.5 MG tablet Take 7.5 mg by mouth at bedtime.   Yes [provider]  tamsulosin  (FLOMAX ) 0.4 MG CAPS capsule Take 0.4 mg by mouth at bedtime.   Yes [provider]     Critical care time: 49   The patient is critically ill with multiple organ system  failure and requires high complexity decision making for assessment and support, frequent evaluation and titration of therapies, advanced monitoring, review of radiographic studies and interpretation of complex data.   Critical Care Time devoted to patient care services, exclusive of separately billable procedures, described in this note is 31 minutes.   Orlin Fairly, MD [facility] See Amion for pager  If no response to pager , please call [phone] until 7pm After 7:00 pm call Elink  [phone] 09/29/2024, 5:31 AM

## 2024-09-29 NOTE — Progress Notes (Signed)
 eLink Physician-Brief Progress Note Patient Name: Adam Stephens DOB: [birth year] MRN: [id number]   Date of Service  09/29/2024  HPI/Events of Note  Notified of bradycardia with rate in the 30s-40s.   Pt is a 78/M with atrial fibrillation on eliquis , brought in due to seizure, L sided weakness, lethargy, prompting code stroke.  Pt with right medial thalamic ICH with extension into the third ventricle and the left lateral ventricle.  Chronic ventriculomegaly that appears to be stable in size compared to prior CT. Pt did not receive AV nodal blocking agents in the ED.   BP 107/58, HR 57, RR 12, O2 sats 99%.   Code status is DNR.   eICU Interventions  Hold all AV nodal blocking agents.  Hydralazine IV to keep SBP 130-150.  Give atropine PRN for HR <38.  Pt is asymptomatic at this time.  Continue keppra, EEG.  Continue antibiotics.  SCDs for DVT Prophylaxis.      Intervention Category Intermediate Interventions: Arrhythmia - evaluation and management  Horice Carrero 09/29/2024, 5:04 AM

## 2024-09-29 NOTE — Progress Notes (Addendum)
 STROKE TEAM PROGRESS NOTE   INTERIM HISTORY/SUBJECTIVE No family at the bedside during exam. LTM in place. Afebrile, de escalate antibiotics  Still having episodes of bradycardia and apnea. CCM following   OBJECTIVE  CBC    Component Value Date/Time   WBC 13.7 (H) 09/28/2024 2253   RBC 3.21 (L) 09/28/2024 2253   HGB 10.9 (L) 09/28/2024 2257   HCT 32.0 (L) 09/28/2024 2257   PLT 333 09/28/2024 2253   MCV 103.4 (H) 09/28/2024 2253   MCH 33.0 09/28/2024 2253   MCHC 31.9 09/28/2024 2253   RDW 14.6 09/28/2024 2253   LYMPHSABS 2.1 09/28/2024 2253   MONOABS 1.0 09/28/2024 2253   EOSABS 0.5 09/28/2024 2253   BASOSABS 0.1 09/28/2024 2253    BMET    Component Value Date/Time   NA 138 09/28/2024 2257   K 4.8 09/28/2024 2257   CL 105 09/28/2024 2257   CO2 23 09/28/2024 2253   GLUCOSE 87 09/28/2024 2257   BUN 27 (H) 09/28/2024 2257   CREATININE 2.00 (H) 09/28/2024 2257   CALCIUM  9.0 09/28/2024 2253   GFRNONAA 41 (L) 09/28/2024 2253    IMAGING past 24 hours CT HEAD POST STROKE FOLLOWUP/TIMED/STAT READ Result Date: 09/29/2024 EXAM: CT HEAD WITHOUT 09/29/2024 05:42:01 AM TECHNIQUE: CT of the head was performed without the administration of intravenous contrast. Automated exposure control, iterative reconstruction, and/or weight based adjustment of the mA/kV was utilized to reduce the radiation dose to as low as reasonably achievable. COMPARISON: Head CT and CTA head and neck 09/28/2024. Brain MRI 12/22/2023. CLINICAL HISTORY: 78 year old male. Code stroke presentation yesterday. Neuro deficit, acute, stroke suspected. Medial right thalamic hemorrhage with intraventricular extension. FINDINGS: BRAIN AND VENTRICLES: Small intraaxial hemorrhage at the junction of the right thalamus and midbrain, cerebral peduncle on series 2 image 21. Rounded intraaxial blood there estimated at 10 mm diameter (1 ml). Stable small to moderate volume of intraventricular extension of blood, mostly the 3rd  ventricle, smaller volume scattered in the left lateral ventricle. Underlying chronic ventriculomegaly (nonspecific) is stable. Underlying advanced chronic small vessel disease redemonstrated including multifocal bilateral deep gray nuclei chronic hypodensity, confluent bilateral cerebral white matter hypodensity. Chronic heterogeneity in the brainstem. No acute intracranial mass effect or midline shift. No extra-axial fluid collection. No evidence of acute infarct. No superimposed acute cortically based infarct by CT. No hydrocephalus. No new areas of intracranial hemorrhage. Calcified atherosclerosis at the skull base. No suspicious intracranial vascular hyperdensity. ORBITS: No acute abnormality. SINUSES AND MASTOIDS: Paranasal sinuses, middle ears and mastoids remain well aerated. SOFT TISSUES AND SKULL: No acute skull fracture. No acute soft tissue abnormality. Stable chronic left facial bone fractures. IMPRESSION: 1. No significant change in Right thalamic-midbrain junction parenchymal hemorrhage ( estimated 1 mL) and small-to-moderate volume intraventricular extension. 2. Underlying chronic nonspecific ventriculomegaly is stable. And underlying advanced chronic small vessel disease. 3. No new intracranial abnormality. Electronically signed by: Helayne Hurst MD 09/29/2024 05:53 AM EST RP Workstation: [id number]   CT ANGIO HEAD NECK W WO CM (CODE STROKE) Result Date: 09/28/2024 EXAM: CTA HEAD AND NECK WITH/WITH AND WITHOUT _study_datetime_ TECHNIQUE: CTA of the head and neck was performed with/with and without the administration of intravenous contrast. Multiplanar 2D and/or 3D reformatted images are provided for review. Automated exposure control, iterative reconstruction, and/or weight based adjustment of the mA/kV was utilized to reduce the radiation dose to as low as reasonably achievable. Stenosis of the internal carotid arteries measured using NASCET criteria. ## Instructions (for model only, do not  include  in report) TASK: If findings are described in the transcript for a unilateral specific cervical artery (e.g. left internal carotid artery or ICA), please do the following: - Keep negative statements for other vessels in the relevant section. - Modify the relevant negative statement to reflect that the contralateral artery (e.g. right ICA) is patent without significant stenosis or dissection. TASK: If findings are described in the transcript for a unilateral specific intracranial artery (e.g. left middle cerebral artery or left MCA) in the transcript, please do the following: - Keep negative statements for other vessels in the relevant section. - Modify the relevant negative statement to reflect that the contralateral artery (e.g. right MCA) is patent proximally without significant stenosis. COMPARISON: None available CLINICAL HISTORY: FINDINGS: CTA NECK: AORTIC ARCH AND ARCH VESSELS: No dissection or arterial injury. No significant stenosis of the brachiocephalic or subclavian arteries. CERVICAL CAROTID ARTERIES: Atherosclerosis of the right carotid bifurcation with approximately 70% stenosis of the ICA origin. No significant (Greater than 50%) stenosis of the left carotid. CERVICAL VERTEBRAL ARTERIES: No dissection, arterial injury, or significant stenosis. LUNGS AND MEDIASTINUM: Unremarkable. SOFT TISSUES: No acute abnormality. BONES: No acute abnormality. CTA HEAD: ANTERIOR CIRCULATION: No significant stenosis of the internal carotid arteries. No significant stenosis of the anterior cerebral arteries. 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 No aneurysm. POSTERIOR CIRCULATION: No significant stenosis of the posterior cerebral arteries. Moderate right and mild left P2 PCA stenosis. No significant stenosis of the basilar artery. No significant stenosis of the vertebral arteries. No aneurysm. OTHER: Dural venous sinuses are not well evaluated. IMPRESSION: 1. No large vessel occlusion. 2. No  evidence of aneurysm. 3. Approximately 70% stenosis of the ICA origin. 4. 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 5. Moderate right and mild left P2 PCA stenosis. Electronically signed by: Gilmore Molt MD 09/28/2024 11:28 PM EST RP Workstation: [id number]   CT HEAD CODE STROKE WO CONTRAST Result Date: 09/28/2024 EXAM: CT HEAD WITHOUT CONTRAST 09/28/2024 11:04:56 PM TECHNIQUE: CT of the head was performed without the administration of intravenous contrast. Automated exposure control, iterative reconstruction, and/or weight based adjustment of the mA/kV was utilized to reduce the radiation dose to as low as reasonably achievable. COMPARISON: CT head 12/22/2023 CLINICAL HISTORY: Neuro deficit, acute, stroke suspected. FINDINGS: BRAIN AND VENTRICLES: Acute 1 cm intraparenchymal hemorrhage in the medial right thalamus with intraventricular extension into the left lateral and third ventricles. Chronic ventriculomegaly and cerebral atrophy is unchanged. Remote bilateral deep gray nuclei lacunar infarcts. No extra-axial collection. No mass effect or midline shift. Patchy white matter hypodensities, nonspecific but compatible with chronic vascular ischemic disease. ORBITS: No acute abnormality. SINUSES: No acute abnormality. SOFT TISSUES AND SKULL: No acute soft tissue abnormality. No skull fracture. Findings discussed with Dr. Sal via telephone at 11:07 PM IMPRESSION: 1. Acute 1 cm intraparenchymal hemorrhage in the medial right thalamus with intraventricular extension into the left lateral and third ventricles. 2. Chronic ventriculomegaly and cerebral atrophy is unchanged. 3. Chronic microvascular ischemic change without evidence of acute large vascular territory infarct. ASPECTS 10. Electronically signed by: Gilmore Molt MD 09/28/2024 11:12 PM EST RP Workstation: [id number]    Vitals:   09/29/24 0630 09/29/24 0645 09/29/24 0700 09/29/24 0752  BP: 104/65 (!) 98/56 105/71   Pulse: (!) 57 (!)  42 (!) 42   Resp: 13 10 (!) 8   Temp:    98.2 F (36.8 C)  TempSrc:    Axillary  SpO2: 98% 98% 97%   Weight:  Height:         PHYSICAL EXAM General:  Alert, well-nourished, well-developed patient in no acute distress Psych:  Mood and affect appropriate for situation CV: Regular rate and rhythm on monitor Respiratory:  Regular, unlabored respirations on room air GI: Abdomen soft and nontender   NEURO:  Mental Status: Lethargic, briefly opens eyes.  Does not follow commands or answer orientation questions Speech/Language: speech is mute, not following commands  Cranial Nerves:  II: Right pupil unreactive large, left pupil reactive.  Unable to test for visual field deficits III, IV, VI: Dysconjugate, left eye in and down and right eye midline. no gaze preference or deviation V: Corneals intact VII: Left facial droop VIII: hearing intact to voice. IX, X: Gag intact XI: Head is midline XII: tongue is midline  Motor:  Moves extremities antigravity but does not maintain antigravity strength. Lower extremities appear equal Tone: is normal and bulk is poor Sensation- localizing  Coordination: does not follow commands to evaluate Myoclonic movement noted in LUE Gait- deferred   ASSESSMENT/PLAN  Adam Stephens is a 78 y.o. male with history of Afib on eliquis , HTN, HLD who initially presented as a code stroke seizing.  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  Right ICA bulb 70% stenosis CT repeat stable ICH and IVH MRI  Pending in a.m. 12/2023 - 2D Echo EF 60-65%  LDL 52 HgbA1c 5.2 VTE prophylaxis -  SCDs aspirin  81 mg daily and Eliquis  (apixaban ) daily prior to admission, now on No antithrombotic  Therapy recommendations:  Pending Disposition:  Pending, CODE STATUS DNR/DNI Need to have GOC conversation with family, no family at the bedside during exam  Seizure like activity versus myoclonus Left upper and lower extremity twitching on presentation Loaded with Keppra 60 mg/kg No clinical seizure since LTM EEG pending  Atrial fibrillation Home Meds: Eliquis , amiodarone  Reversed with Andexxa Continue telemetry monitoring Hold off AC for now due to ICH  Bradycardia One dose of atropine given by EMS PCCM Consulted Correlates with his apnea HR 30s-50s Close telemonitoring  Hypertension Home meds none BP high on presentation, now stable on the low end BP goal less than 160 Long-term BP goal normotensive  Complicated UTI AKI on CKD 3 AA Abx: Rocephin D/c vanc and zosyn Urine culture pending creatinine 2.00-1.65-1.57 NS at 75 mL/h Avoid nephrotoxic medications  Dysphagia Patient has post-stroke dysphagia, SLP consulted N.p.o. now On IV fluid Needed GOC discussion before consideration of tube feeding  Other Active Problems Leukocytosis WBC 13.7, afebrile Dementia mRs: 4 Lives in memory care facility   Hospital day # 1  Patient seen and examined by NP/APP with MD. MD to update note as needed.   Jorene Last, DNP, FNP-BC [facility] Pager: [phone]  ATTENDING NOTE: I reviewed above note and agree with the assessment and plan. Pt was seen and examined.   RN at the bedside. Pt lying in bed, eyes closed, not open on voice or pain stimulation. Nonverbal and not following commands. With forced eye opening, left eye intermittent downward gaze, right eye middle to right position, left pupil 2mm, and R pupil  5mm, sluggish to light bilaterally. Not blinking to visual threat. L facial droop. Tongue protrusion not cooperative. On pain stimulation, RUE  localize to pain and against gravity. LUE mild withdraw to pain. BLEs withdraw to pain and seems symmetrical. Sensation, coordination and gait not tested.   For detailed assessment and plan, please refer to above as I have made changes wherever appropriate.   Ary Cummins, MD PhD Stroke Neurology 09/29/2024 6:01 PM  This patient is critically ill due to ICH and IVH, bradycardia, hypotension, seizure-like activity, advanced dementia and at significant risk of neurological worsening, death form hematoma expansion, obstructive hydrocephalus, status epilepticus, aspiration. This patient's care requires constant monitoring of vital signs, hemodynamics, respiratory and cardiac monitoring, review of multiple databases, neurological assessment, discussion with family, other specialists and medical decision making of high complexity. I spent 40 minutes of neurocritical care time in the care of this patient.    To contact Stroke Continuity provider, please refer to [email or web address]. After hours, contact General Neurology

## 2024-09-29 NOTE — Progress Notes (Signed)
 OT Cancellation Note  Patient Details Name: Adam Stephens MRN: [id number] DOB: [birth year]   Cancelled Treatment:    Reason Eval/Treat Not Completed: Patient not medically ready;Active bedrest order (Will assess when activity orders updated.)  [facility] 09/29/2024, 5:52 AM Kreg Sink, OT/L   Acute OT Clinical Specialist Acute Rehabilitation Services Pager [phone] Office [phone]

## 2024-09-29 NOTE — TOC CAGE-AID Note (Signed)
 Transition of Care [facility]) - CAGE-AID Screening   Patient Details  Name: Adam Stephens MRN: [id number] Date of Birth: 04-09-1946  [facility] CM/SW Contact:    Inocente GORMAN Kindle, LCSW Phone Number: 09/29/2024, 8:47 AM   Clinical Narrative: Patient is disoriented and unable to participate in screening.    CAGE-AID Screening: Substance Abuse Screening unable to be completed due to: : Patient unable to participate

## 2024-09-30 ENCOUNTER — Inpatient Hospital Stay (HOSPITAL_COMMUNITY)

## 2024-09-30 DIAGNOSIS — I610 Nontraumatic intracerebral hemorrhage in hemisphere, subcortical: Secondary | ICD-10-CM | POA: Diagnosis not present

## 2024-09-30 DIAGNOSIS — I129 Hypertensive chronic kidney disease with stage 1 through stage 4 chronic kidney disease, or unspecified chronic kidney disease: Secondary | ICD-10-CM | POA: Diagnosis not present

## 2024-09-30 DIAGNOSIS — I959 Hypotension, unspecified: Secondary | ICD-10-CM

## 2024-09-30 DIAGNOSIS — R569 Unspecified convulsions: Secondary | ICD-10-CM

## 2024-09-30 DIAGNOSIS — Z515 Encounter for palliative care: Secondary | ICD-10-CM

## 2024-09-30 DIAGNOSIS — F03C0 Unspecified dementia, severe, without behavioral disturbance, psychotic disturbance, mood disturbance, and anxiety: Secondary | ICD-10-CM

## 2024-09-30 DIAGNOSIS — Z66 Do not resuscitate: Secondary | ICD-10-CM

## 2024-09-30 DIAGNOSIS — T45515A Adverse effect of anticoagulants, initial encounter: Secondary | ICD-10-CM | POA: Diagnosis not present

## 2024-09-30 DIAGNOSIS — I739 Peripheral vascular disease, unspecified: Secondary | ICD-10-CM

## 2024-09-30 DIAGNOSIS — I615 Nontraumatic intracerebral hemorrhage, intraventricular: Secondary | ICD-10-CM | POA: Diagnosis not present

## 2024-09-30 DIAGNOSIS — G309 Alzheimer's disease, unspecified: Secondary | ICD-10-CM

## 2024-09-30 LAB — GLUCOSE, CAPILLARY
Glucose-Capillary: 78 mg/dL (ref 70–99)
Glucose-Capillary: 77 mg/dL (ref 70–99)
Glucose-Capillary: 79 mg/dL (ref 70–99)
Glucose-Capillary: 75 mg/dL (ref 70–99)
Glucose-Capillary: 80 mg/dL (ref 70–99)

## 2024-09-30 LAB — CBC
HCT: 33.2 % — ABNORMAL LOW (ref 39.0–52.0)
Hemoglobin: 10.6 g/dL — ABNORMAL LOW (ref 13.0–17.0)
MCH: 33.3 pg (ref 26.0–34.0)
MCHC: 31.9 g/dL (ref 30.0–36.0)
MCV: 104.4 fL — ABNORMAL HIGH (ref 80.0–100.0)
Platelets: 318 K/uL (ref 150–400)
RBC: 3.18 MIL/uL — ABNORMAL LOW (ref 4.22–5.81)
RDW: 14.9 % (ref 11.5–15.5)
WBC: 12.9 K/uL — ABNORMAL HIGH (ref 4.0–10.5)
nRBC: 0 % (ref 0.0–0.2)

## 2024-09-30 LAB — BASIC METABOLIC PANEL WITH GFR
Anion gap: 9 (ref 5–15)
BUN: 20 mg/dL (ref 8–23)
CO2: 20 mmol/L — ABNORMAL LOW (ref 22–32)
Calcium: 8.8 mg/dL — ABNORMAL LOW (ref 8.9–10.3)
Chloride: 111 mmol/L (ref 98–111)
Creatinine, Ser: 1.57 mg/dL — ABNORMAL HIGH (ref 0.61–1.24)
GFR, Estimated: 45 mL/min — ABNORMAL LOW (ref >60)
Glucose, Bld: 72 mg/dL (ref 70–99)
Potassium: 3.7 mmol/L (ref 3.5–5.1)
Sodium: 140 mmol/L (ref 135–145)

## 2024-09-30 MED ORDER — VALPROATE SODIUM 100 MG/ML IV SOLN
500.0000 mg | Freq: Once | INTRAVENOUS | Status: AC
  Administered 2024-10-01: 500 mg via INTRAVENOUS
  Filled 2024-09-30: qty 5

## 2024-09-30 MED ORDER — HYDRALAZINE HCL 20 MG/ML IJ SOLN
5.0000 mg | INTRAMUSCULAR | Status: DC | PRN
  Administered 2024-09-30 (×2): 5 mg via INTRAVENOUS
  Filled 2024-09-30 (×2): qty 1

## 2024-09-30 MED ORDER — SODIUM CHLORIDE 0.9 % IV SOLN
1.0000 g | INTRAVENOUS | Status: DC
  Administered 2024-09-30: 1 g via INTRAVENOUS
  Filled 2024-09-30: qty 10

## 2024-09-30 MED ORDER — GADOBUTROL 1 MMOL/ML IV SOLN
6.0000 mL | Freq: Once | INTRAVENOUS | Status: AC | PRN
  Administered 2024-09-30: 6 mL via INTRAVENOUS

## 2024-09-30 NOTE — Progress Notes (Signed)
 PT Cancellation Note  Patient Details Name: Adam Stephens MRN: [id number] DOB: 10/30/1946   Cancelled Treatment:    Reason Eval/Treat Not Completed: PT screened, no needs identified, will sign off  -per MD, goals of care discussion at this time. PT to sign off, please reconsult if medically appropriate.   Addalynn Kumari Blish S, PT DPT Acute Rehabilitation Services Secure Chat Preferred  Office [phone]     [facility] [place] 09/30/2024, 11:08 AM

## 2024-09-30 NOTE — Procedures (Addendum)
 Patient Name: Adam Stephens  MRN: [id number]  Epilepsy Attending: Arlin MALVA Krebs  Referring Physician/Provider: Khaliqdina, Salman, MD  Duration: 09/29/2024 0803 to 09/30/2024 1019  Patient history: 78 y.o. male with history of Afib on eliquis , HTN, HLD who initially presented as a code stroke seizing. EEG to evaluate for seizure  Level of alertness: comatose/ lethargic   AEDs during EEG study: None  Technical aspects: This EEG study was done with scalp electrodes positioned according to the 10-20 International system of electrode placement. Electrical activity was reviewed with band pass filter of 1-70Hz , sensitivity of 7 uV/mm, display speed of 66mm/sec with a 60Hz  notched filter applied as appropriate. EEG data were recorded continuously and digitally stored.  Video monitoring was available and reviewed as appropriate.  Description: EEG showed continuous generalized 3 to 6 Hz theta-delta slowing admixed with 12-15hz  beta activity. Hyperventilation and photic stimulation were not performed.    EEG was disconnected between 09/30/2024 0448 to  0547 for MRI brain  ABNORMALITY - Continuous slow, generalized  IMPRESSION: This study is suggestive of generalized cerebral dysfunction (encephalopathy). No seizures or epileptiform discharges were seen throughout the recording.  Tramya Schoenfelder O Braydn Carneiro

## 2024-09-30 NOTE — Progress Notes (Signed)
 LTM D/C'd. No noted skin break down. [facility] notified.

## 2024-09-30 NOTE — Progress Notes (Addendum)
 STROKE TEAM PROGRESS NOTE   INTERIM HISTORY/SUBJECTIVE No family at the bedside during exam. LTM in place- no seizures, will d/c LTM today. No verbal output, not following commands, moving purposefully.   OBJECTIVE  CBC    Component Value Date/Time   WBC 12.9 (H) 09/30/2024 0259   RBC 3.18 (L) 09/30/2024 0259   HGB 10.6 (L) 09/30/2024 0259   HCT 33.2 (L) 09/30/2024 0259   PLT 318 09/30/2024 0259   MCV 104.4 (H) 09/30/2024 0259   MCH 33.3 09/30/2024 0259   MCHC 31.9 09/30/2024 0259   RDW 14.9 09/30/2024 0259   LYMPHSABS 2.1 09/28/2024 2253   MONOABS 1.0 09/28/2024 2253   EOSABS 0.5 09/28/2024 2253   BASOSABS 0.1 09/28/2024 2253    BMET    Component Value Date/Time   NA 140 09/30/2024 0259   K 3.7 09/30/2024 0259   CL 111 09/30/2024 0259   CO2 20 (L) 09/30/2024 0259   GLUCOSE 72 09/30/2024 0259   BUN 20 09/30/2024 0259   CREATININE 1.57 (H) 09/30/2024 0259   CALCIUM  8.8 (L) 09/30/2024 0259   GFRNONAA 45 (L) 09/30/2024 0259    IMAGING past 24 hours MR BRAIN W WO CONTRAST Result Date: 09/30/2024 EXAM: MRI BRAIN WITH AND WITHOUT CONTRAST 09/30/2024 05:48:00 AM TECHNIQUE: Multiplanar multisequence MRI of the head/brain was performed with and without the administration of intravenous contrast. COMPARISON: Head CT 09/29/2024 and earlier, and brain MRI 12/22/2023. CLINICAL HISTORY: 78 year old male with acute neuro deficit, stroke suspected. CT shows small acute hemorrhage at right thalamus/midbrain junction. FINDINGS: BRAIN AND VENTRICLES: T1 isointense and T2 heterogeneous mostly iso to low signal intensity blood products redemonstrated at the right midbrain and extending into the adjacent 3rd ventricle. Small volume of hemorrhage layering in the occipital horns, greater on the left. Blood product size and configuration not significantly changed from head CT yesterday. Intraaxial hemorrhage appears centered at the right midbrain, with intraventricular extension not significantly  changed from head CT yesterday. SWI demonstrates numerous additional bilateral thalamic chronic microhemorrhages (series 14 image 35) and an intermediate number of scattered cerebral hemispheres chronic microhemorrhages also. Evidence of chronic blood products also in the deep cerebellar nuclei, more so the left (series 14 image 17). At this time the extent does not rise to the level of amyloid angiopathy. There has been mild progression since the February MRI (left deep cerebellar nucleus in particular). Chronic nonspecific ventriculomegaly. Evans index 0.4 and callosal angle 49 degrees. Extensive chronic periventricular and other cerebral white matter T2 and FLAIR hyperintensity. No strong evidence of transependymal edema. Chronic lacunar infarcts in the bilateral corona radiata, bilateral deep gray nuclei, and left pons. Multiple small foci of periventricular white matter restricted diffusion along the bodies of the lateral ventricles (more numerous on the left, series 9 image 70) in keeping with acute periventricular white matter lacunar infarcts. Superimposed multiple punctate acute periventricular white matter lacunar infarcts more numerous on the left along the superior lateral ventricles, without associated hemorrhage or mass effect. Otherwise, DWI artifact is associated with the acute blood products. No midline shift. Basilar cisterns remain patent. Following contrast, no abnormal enhancement. The sella is unremarkable. Normal flow voids. ORBITS: No acute abnormality. SINUSES: No acute abnormality. BONES AND SOFT TISSUES: Normal bone marrow signal and enhancement. No acute soft tissue abnormality. IMPRESSION: 1. Small intra-axial hemorrhage centered at the right midbrain with intraventricular extension, not significantly changed from head CT yesterday. 2. 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 And (2) numerous chronic microhemorrhages in the brain which have mildly progressed since February of this year, but currently does not rise to the level of amyloid angiopathy. 3. Chronic nonspecific ventriculomegaly, (Evans index 0.4 and callosal angle 49 degrees). Superimposed normal pressure hydrocephalus cannot be excluded. Electronically signed by: Helayne Hurst MD 09/30/2024 06:14 AM EST RP Workstation: [id number]   Overnight EEG with video Result Date: 09/30/2024 Shelton Arlin KIDD, MD     09/30/2024  6:24 AM Patient Name: Adam Stephens MRN: [id number] Epilepsy Attending: Arlin KIDD Shelton Referring Physician/Provider: Khaliqdina, Salman, MD Duration: 09/29/2024 0803 to 09/30/2024 0615 Patient history: 78 y.o. male with history of Afib on eliquis , HTN, HLD who initially presented as a code stroke seizing. EEG to evaluate for seizure Level of alertness: comatose/ lethargic AEDs during EEG study: None Technical aspects: This EEG study was done with scalp electrodes positioned according to the 10-20 International system of electrode placement. Electrical activity was reviewed with band pass filter of 1-70Hz , sensitivity of 7 uV/mm, display speed of 43mm/sec with a 60Hz  notched filter applied as appropriate. EEG data were recorded continuously and digitally stored.  Video monitoring was available and reviewed as appropriate. Description: EEG showed continuous generalized 3 to 6 Hz theta-delta slowing admixed with 12-15hz  beta activity. Hyperventilation and photic stimulation were not performed.  EEG was disconnected between 09/30/2024 0448 to  0547 for MRI brain ABNORMALITY - Continuous slow, generalized IMPRESSION: This study is suggestive of generalized cerebral dysfunction (encephalopathy). No seizures or epileptiform discharges were seen throughout the recording. Priyanka KIDD Shelton    Vitals:   09/30/24 0600 09/30/24 0630  09/30/24 0700 09/30/24 0800  BP: 115/63 109/62 119/68   Pulse: (!) 54 (!) 51 (!) 49   Resp: 14 13 15    Temp:    99.2 F (37.3 C)  TempSrc:    Axillary  SpO2: 97% 98% 99%   Weight:      Height:         PHYSICAL EXAM General:  Alert, well-nourished, well-developed patient in no acute distress Psych:  Mood and affect appropriate for situation CV: Regular rate and rhythm on monitor Respiratory:  Regular, unlabored respirations on room air GI: Abdomen soft and nontender   NEURO:  Mental Status: Lethargic, briefly opens eyes.  Does not follow commands or answer orientation questions Speech/Language: speech is mute, not following commands  Cranial Nerves:  II: Right pupil unreactive large, left pupil reactive.  Unable to test for visual field deficits III, IV, VI: Dysconjugate, left eye in and down and right eye midline. no gaze preference or deviation V: Corneals intact VII: Left facial droop VIII: hearing intact to voice. IX, X: Gag intact XI: Head is midline XII: tongue is midline  Motor:  Moves extremities antigravity but does not maintain antigravity strength. Lower extremities appear equal Tone: is normal and bulk is poor Sensation- localizing  Coordination: does not follow commands to evaluate Myoclonic movement noted in LUE Gait- deferred   ASSESSMENT/PLAN  Mr. TAHJ NJOKU is a 78 y.o. male with history of Afib on eliquis , HTN, HLD who initially presented as a code stroke seizing.  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  Right ICA bulb 70% stenosis CT repeat stable ICH and IVH MRI Small intra-axial  hemorrhage centered at the right midbrain with intraventricular extension, not significantly changed from head CT yesterday. 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 No seizures or epileptiform discharges were seen throughout the recording.  12/2023 - 2D Echo EF 60-65%  LDL 52 HgbA1c 5.2 VTE prophylaxis - SCDs aspirin  81 mg daily and Eliquis  (apixaban ) daily prior to admission, now on No antithrombotic  Therapy recommendations:  Pending Disposition:  Pending, CODE STATUS DNR/DNI Palliative care on board, family leaning towards comfort but would like to hold off 1-2 days to allow family visits  Seizure like activity versus myoclonus Left upper and lower extremity twitching on presentation Loaded with Keppra 60 mg/kg No clinical seizure since LTM EEG no seizure- d/c today  Atrial fibrillation Home Meds: Eliquis , amiodarone  Reversed with Andexxa Continue telemetry monitoring Hold off AC for now due to ICH  Bradycardia One dose of atropine given by EMS PCCM Consulted Correlates with his apnea HR 30s-50s Close telemonitoring  Hypertension Home meds none BP high on presentation, now stable on the low end BP goal less than 160 Long-term BP goal normotensive  Complicated UTI AKI on CKD 3 AA Abx: Rocephin D/c vanc and zosyn Tmax 100.5 Urine culture pending creatinine 2.00-1.65-1.57 Leukocytosis WBC 13.7-15.7-12.9  NS at 75 mL/h Avoid nephrotoxic medications  Dysphagia Patient has post-stroke dysphagia, SLP consulted N.p.o. now On IV fluid No artifical feeding at this time as family leaning towards comfort  Other Active Problems Dementia mRs: 4 Lives in memory care facility   Hospital day # 2  Patient seen and examined by NP/APP with MD. MD to update note as needed.   Jorene Last, DNP, FNP-BC [facility] Pager: [phone]  ATTENDING NOTE: I reviewed above note and agree with the assessment and plan. Pt was seen and examined.   RN at bedside.  Patient still eyes closed, not open to voice, left eye intermittent downward gaze, right eye middle to right position, left pupil 2mm, and R pupil 5mm, sluggish to light bilaterally. Not blinking to visual threat. L facial droop. Tongue protrusion not cooperative. On pain stimulation, RUE extension posturing. LUE no significant withdraw to pain. BLEs withdraw to pain and R more than left. Sensation, coordination and gait not tested.    For detailed assessment and plan, please refer to above as I have made changes wherever appropriate.    Ary Cummins, MD PhD Stroke Neurology 09/30/2024 5:53 PM  This patient is critically ill due to ICH and IVH, bradycardia, hypotension, seizure-like activity, advanced dementia and at significant risk of neurological worsening, death form hematoma expansion, obstructive hydrocephalus, status epilepticus, aspiration. This patient's care requires constant monitoring of vital signs, hemodynamics, respiratory and cardiac monitoring, review of multiple databases, neurological assessment, discussion with family, other specialists and medical decision making of high complexity. I spent 35 minutes of neurocritical care time in the care of this patient.   To contact Stroke Continuity provider, please refer to [email or web address]. After hours, contact General Neurology

## 2024-09-30 NOTE — TOC Initial Note (Signed)
 Transition of Care [facility]) - Initial/Assessment Note    Patient Details  Name: Adam Stephens MRN: [id number] Date of Birth: 1946/01/30  [facility]) CM/SW Contact:    Rashi Giuliani E Chong Wojdyla, LCSW Phone Number: 09/30/2024, 9:37 AM  Clinical Narrative:                 Patient was admitted for ICH. Patient is disoriented x 4. CSW called patient's son Arvella. Arvella confirmed that patient resides at [facility] in their Memory Care unit and has been there for about a year. Patient is in a wheelchair at baseline. Patient currently receives Speech Therapy at his ALF for swallowing Patient does not speak clearly at baseline, typically you can understand a few words per [facility]. Arvella states his hope is that patient can return to [facility] when medically ready.  CSW called [facility], spoke with Resident Care Coordinator Therisa. Therisa confirms patient can return when medically ready if appropriate. She declined needing to come assess patient. Will need DC FL2 and DC Summary.  Expected Discharge Plan: Memory Care Barriers to Discharge: Continued Medical Work up   Patient Goals and CMS Choice   CMS Medicare.gov Compare Post Acute Care list provided to:: Patient Represenative (must comment) Choice offered to / list presented to : Adult Children      Expected Discharge Plan and Services       Living arrangements for the past 2 months: Assisted Living Facility                                      Prior Living Arrangements/Services Living arrangements for the past 2 months: Assisted Living Facility Lives with:: Facility Resident Patient language and need for interpreter reviewed:: Yes Do you feel safe going back to the place where you live?: Yes      Need for Family Participation in Patient Care: Yes (Comment) Care giver support system in place?: Yes (comment) Current home services: DME, Other (comment) (ST) Criminal Activity/Legal Involvement Pertinent to Current  Situation/Hospitalization: No - Comment as needed  Activities of Daily Living   ADL Screening (condition at time of admission) Independently performs ADLs?: No Does the patient have a NEW difficulty with bathing/dressing/toileting/self-feeding that is expected to last >3 days?: No Does the patient have a NEW difficulty with getting in/out of bed, walking, or climbing stairs that is expected to last >3 days?: No Does the patient have a NEW difficulty with communication that is expected to last >3 days?: No Is the patient deaf or have difficulty hearing?: Yes Does the patient have difficulty seeing, even when wearing glasses/contacts?: No Does the patient have difficulty concentrating, remembering, or making decisions?: Yes  Permission Sought/Granted Permission sought to share information with : Facility Industrial/product Designer granted to share information with : Yes, Verbal Permission Granted (by son Arvella)     Permission granted to share info w AGENCY: as needed for DC planning        Emotional Assessment       Orientation: : Fluctuating Orientation (Suspected and/or reported Sundowners) Alcohol / Substance Use: Not Applicable Psych Involvement: No (comment)  Admission diagnosis:  ICH (intracerebral hemorrhage) (HCC) [I61.9] Right-sided nontraumatic intracerebral hemorrhage, unspecified cerebral location [facility]) [I61.9] Patient Active Problem List   Diagnosis Date Noted   ICH (intracerebral hemorrhage) (HCC) 09/28/2024   TIA (transient ischemic attack) 12/22/2023   Acute-on-chronic kidney injury 12/22/2023  UTI (urinary tract infection) 12/22/2023   Atrial fibrillation with RVR (HCC) 12/08/2023   Fall 12/08/2023   Anemia 12/08/2023   PCP:  Pcp, No Pharmacy:   [facility], [place] - [address] [place] [address] Phone: [phone] Fax: [phone]     Social Drivers of Health (SDOH) Social History: SDOH Screenings   Food  Insecurity: No Food Insecurity (09/29/2024)  Housing: Low Risk  (09/29/2024)  Transportation Needs: No Transportation Needs (09/29/2024)  Utilities: Not At Risk (09/29/2024)  Financial Resource Strain: Low Risk  (01/02/2023)   Received from [facility]  Physical Activity: Unknown (07/11/2022)   Received from [facility]  Social Connections: Moderately Isolated (12/24/2023)  Stress: No Stress Concern Present (07/11/2022)   Received from [facility]  Tobacco Use: Medium Risk (09/06/2023)   Received from [facility]   SDOH Interventions:     Readmission Risk Interventions     No data to display

## 2024-09-30 NOTE — Progress Notes (Deleted)
 STAT Routine EEG complete. Results pending.

## 2024-09-30 NOTE — TOC Progression Note (Addendum)
 Transition of Care [facility]) - Progression Note    Patient Details  Name: Adam Stephens MRN: [id number] Date of Birth: [birth year]  Transition [facility]) CM/SW Contact  Shamar Kracke E Jayjay Littles, LCSW Phone Number: 09/30/2024, 12:02 PM  Clinical Narrative:    Notified in rounds by NP that family has elected for return to ALF Memory Care with Hospice.  Spoke with Therisa at [facility] who states they can accommodate this, they use [facility] for hospice. Referral made to [facility] with [facility] to follow up.  2:55- Family would like patient to be assessed for Hospice Home in [facility] or [facility]. Spoke to son Arvella, explained it is based on eligibility, he verbalized understanding. He states their first choice would be [facility].  Called [facility] who will assess patient for their [facility] location.  Expected Discharge Plan: Memory Care Barriers to Discharge: Continued Medical Work up               Expected Discharge Plan and Services       Living arrangements for the past 2 months: Assisted Living Facility                                       Social Drivers of Health (SDOH) Interventions SDOH Screenings   Food Insecurity: No Food Insecurity (09/29/2024)  Housing: Low Risk  (09/29/2024)  Transportation Needs: No Transportation Needs (09/29/2024)  Utilities: Not At Risk (09/29/2024)  Financial Resource Strain: Low Risk  (01/02/2023)   Received from [facility]  Physical Activity: Unknown (07/11/2022)   Received from [facility]  Social Connections: Moderately Isolated (12/24/2023)  Stress: No Stress Concern Present (07/11/2022)   Received from [facility]  Tobacco Use: Medium Risk (09/06/2023)   Received from [facility]    Readmission Risk Interventions     No data to display

## 2024-09-30 NOTE — Progress Notes (Signed)
 OT Cancellation Note  Patient Details Name: ADRION MENZ MRN: [id number] DOB: 1946-08-12   Cancelled Treatment:    Reason Eval/Treat Not Completed: Patient not medically ready (Goals of care discussions, per MD therapies to sign off for now.)  Lucie JONETTA Kendall 09/30/2024, 11:09 AM

## 2024-09-30 NOTE — Consult Note (Signed)
 NAME:  Adam Stephens, MRN:  [id number], DOB:  04-04-1946, LOS: 2 ADMISSION DATE:  09/28/2024, CONSULTATION DATE:  09/29/2024 REFERRING MD:  Ellouise Mari, MD, CHIEF COMPLAINT:  Hypotension and bradycardia   History of Present Illness:  78 y/o male with PMH for HTN, HLD, A fib on Eliquis  presents from NH with AMS and seizing, BIBEMS to ED and was a code stroke. He was found to have ICH, maintaining his airways and anticoagulation revereed and he was loaded with Keppra. PCCM called as he has been persistently hypotensive and bradycardia to HR high 30's. UA consistent with UTI.  Pertinent  Medical History  HTN, HLD, A fib on Eliquis    Significant Hospital Events: Including procedures, antibiotic start and stop dates in addition to other pertinent events   11/9: neuro-icu admit 11/11: MRI stable ICH; neuro exam poor, GOC>family leaning towards CC/hospice. PCCM sign off.  Interim History / Subjective:  NAEO. Neuro exams remains poor. No command following. Does not open eyes to voice/noxious stimuli. Moves BLE spontaneously. Cathy, significant other expressed wanting palliative consult to discuss options including comfort measures. His daughter lives in [place] and is en route, son is more local and can easily come for meeting (son is HCPOA). She expressed patient would not want to continue to live in this condition and has a low quality of life at baseline. Interested if his previous facility will accept him with hospice following. Will inform case mgmt.   Discussed plan with primary team. No further ICU needs at this time. PCCM to sign off.  Objective    Blood pressure 124/62, pulse (!) 45, temperature 99.2 F (37.3 C), temperature source Axillary, resp. rate 13, height 5' 10 (1.778 m), weight 60.4 kg, SpO2 100%.        Intake/Output Summary (Last 24 hours) at 09/30/2024 1030 Last data filed at 09/30/2024 9374 Gross per 24 hour  Intake 567.24 ml  Output 2375 ml  Net -1807.76 ml    Filed Weights   09/28/24 2200 09/29/24 0016  Weight: 62.2 kg 60.4 kg   Examination: General: NAD, not alert and non-verbal HENT: [place]/at, dry mm Lungs: CTAB Cardiovascular: brady, AS Grade 3 murmur Abdomen: soft nd  Extremities: no cyanosis, clubbing or edema Neuro: No command following, does not open eyes to noxious/verbal stimuli, Extends in BUE, spontaneously moves BLE, right pupil 5mm non-reactive, left pupil 2mm sluggish GU: n/a  Resolved problem list   Assessment and Plan  ICH Hypotension Sepsis AMS 2/2 CVA/UTI; prior CVA, Alzheimer's dx; nonverbal, 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 Recommend further GOC. Family will likely transition to CC while inpatient vs discharging back to previous facility if hospice services are offered. Palliative consulted.  DVT ppx: held  Labs   CBC: Recent Labs  Lab 09/28/24 2253 09/28/24 2257 09/29/24 1000 09/30/24 0259  WBC 13.7*  --  15.7* 12.9*  NEUTROABS 9.9*  --   --   --   HGB 10.6* 10.9* 11.4* 10.6*  HCT 33.2* 32.0* 35.0* 33.2*  MCV 103.4*  --  102.9* 104.4*  PLT  333  --  355 318   Basic Metabolic Panel: Recent Labs  Lab 09/28/24 2253 09/28/24 2257 09/29/24 0617 09/29/24 1000 09/30/24 0259  NA 136 138  --  140 140  K 4.7 4.8  --  4.1 3.7  CL 103 105  --  108 111  CO2 23  --   --  18* 20*  GLUCOSE 97 87  --  73 72  BUN 26* 27*  --  22 20  CREATININE 1.70* 2.00* 1.65* 1.57* 1.57*  CALCIUM  9.0  --    --  8.9 8.8*   GFR: Estimated Creatinine Clearance: 32.6 mL/min (A) (by C-G formula based on SCr of 1.57 mg/dL (H)). Recent Labs  Lab 09/28/24 2253 09/28/24 2258 09/29/24 1000 09/30/24 0259  WBC 13.7*  --  15.7* 12.9*  LATICACIDVEN  --  0.6  --   --    Liver Function Tests: Recent Labs  Lab 09/28/24 2253  AST 15  ALT 10  ALKPHOS 63  BILITOT 0.3  PROT 6.3*  ALBUMIN 2.9*   No results for input(s): LIPASE, AMYLASE in the last 168 hours. No results for input(s): AMMONIA in the last 168 hours.  ABG    Component Value Date/Time   TCO2 25 09/28/2024 2257    Coagulation Profile: Recent Labs  Lab 09/28/24 2253  INR 1.3*   Cardiac Enzymes: No results for input(s): CKTOTAL, CKMB, CKMBINDEX, TROPONINI in the last 168 hours.  HbA1C: Hgb A1c MFr Bld  Date/Time Value Ref Range Status  09/29/2024 06:17 AM 5.2 4.8 - 5.6 % Final    Comment:    (NOTE) Diagnosis of Diabetes The following HbA1c ranges recommended by the American Diabetes Association (ADA) may be used as an aid in the diagnosis of diabetes mellitus.  Hemoglobin             Suggested A1C NGSP%              Diagnosis  <5.7                   Non Diabetic  5.7-6.4                Pre-Diabetic  >6.4                   Diabetic  <7.0                   Glycemic control for                       adults with diabetes.    12/23/2023 06:36 AM 5.4 4.8 - 5.6 % Final    Comment:    (NOTE) Pre diabetes:          5.7%-6.4%  Diabetes:              >6.4%  Glycemic control for   <7.0% adults with diabetes    CBG: Recent Labs  Lab 09/28/24 2249 09/29/24 2028 09/29/24 2317 09/30/24 0319 09/30/24 0727  GLUCAP 83 92 81 78 77    Review of Systems:   AMS not able to provide ROS  Past Medical History:  He,  has a past medical history of Atrial fibrillation (HCC), Dementia (HCC), High cholesterol, and Hypertension.   Surgical History:  No past surgical history on file.   Social History:       Family History:  His family history is not on file.   Allergies Allergies  Allergen  Reactions   Hydrochlorothiazide Other (See Comments)    Unknown reaction   Norvasc [Amlodipine] Other (See Comments)    Unknown reaction   Tekturna [Aliskiren] Other (See Comments)    Unknown reaction     Home Medications  Prior to Admission medications   Medication Sig Start Date End Date Taking? Authorizing Provider  amiodarone  (PACERONE ) 200 MG tablet Take 1 tablet (200 mg total) by mouth daily. 12/10/23  Yes Christia Budds, MD  apixaban  (ELIQUIS ) 5 MG TABS tablet Take 5 mg by mouth 2 (two) times daily. 04/07/23  Yes [provider]  aspirin  EC 81 MG tablet Take 1 tablet (81 mg total) by mouth daily. Swallow whole. 12/24/23  Yes Janna Ferrier, DO  atorvastatin  (LIPITOR) 40 MG tablet Take 40 mg by mouth at bedtime.   Yes [provider]  famotidine  (PEPCID ) 20 MG tablet Take 1 tablet (20 mg total) by mouth daily. 12/24/23 09/29/25 Yes Janna Ferrier, DO  folic acid  (FOLVITE ) 1 MG tablet Take 0.5 tablets (0.5 mg total) by mouth daily. 12/24/23  Yes Gomes, Adriana, DO  GOODSENSE CLEARLAX 17 GM/SCOOP powder Take 17 g by mouth every evening. 08/08/23  Yes [provider]  GOODSENSE PAIN RELIEF EXTRA ST 500 MG tablet Take 1,000 mg by mouth in the morning and at bedtime. 09/25/24  Yes [provider]  melatonin 3 MG TABS tablet Take 1 tablet (3 mg total) by mouth at bedtime as needed. 12/24/23  Yes Gomes, Adriana, DO  mirtazapine  (REMERON ) 7.5 MG tablet Take 7.5 mg by mouth at bedtime.   Yes [provider]  tamsulosin  (FLOMAX ) 0.4 MG CAPS capsule Take 0.4 mg by mouth at bedtime.   Yes [provider]     Critical care time: NA   Warren Shade, DNP, AGACNP-BC [facility]  Please see [email or web address] for pager details.  From 7A-7P if no response, please call [phone]. After hours, please call ELink [phone].

## 2024-09-30 NOTE — Consult Note (Signed)
 Consultation Note Date: 09/30/2024   Patient Name: Adam Stephens  DOB: 01-08-1946  MRN: [id number]  Age / Sex: 78 y.o., male  PCP: Pcp, No Referring Physician: Stroke, Md, MD  Reason for Consultation: Establishing goals of care  HPI/Patient Profile: 78 y.o. male   admitted on 09/28/2024 with  PMH for HTN, HLD, A fib on Eliquis  presents from memory care  with AMS and possible seizure.    He was found to have ICH, maintaining his airways and anticoagulation revereed and he was loaded with Keppra. PCCM called as he has been persistently hypotensive and bradycardia to HR high 30's. UA consistent with UTI.   Neuro exams remains poor. No command following. Does not open eyes to voice/noxious stimuli. Moves BLE spontaneously.   Admitted for treatment and stabilization.  Family face treatment option decisions, advanced directive decisions and anticipatory care needs.       Clinical Assessment and Goals of Care:  This NP Ronal Plants reviewed medical records, received report from team, assessed the patient and then meet at the patient's bedside along with son/Brad Kay Ramp Michael/SO to discuss diagnosis, prognosis, GOC, EOL wishes disposition and options.   Concept of Palliative Care was introduced as specialized medical care for people and their families living with serious illness.  If focuses on providing relief from the symptoms and stress of a serious illness.  The goal is to improve quality of life for both the patient and the family.   Values and goals of care important to patient and family were attempted to be elicited.  Created space and opportunity for family to explore thoughts and feelings regarding current medical situation.  Family at bedside report continued physical, functional and cognitive decline over the past many months.  He lives in memory care.  Family present very much have a  understanding that patient would not want ongoing aggressive measures and would not want to live in current condition.  Patient has 2 children his son Arvella lives local  and daughter/lives in [place].  Ramp Sharper has a son.  There are multiple grandchildren and son who live out of state.     A  discussion was had today regarding advanced directives.  Concepts specific to code status, artifical feeding and hydration, continued IV antibiotics and rehospitalization was had.    The difference between a aggressive medical intervention path  and a palliative comfort care path for this patient at this time was had.  Education offered on hospice benefit; philosophy and eligibility.  We discussed hospice services delivered at inpatient facility when prognosis is limited to less than a few weeks.  Both family members present today voice experience with other family members under hospice benefit.         Family clearly understand the seriousness of current medical situation.  They recognize patient's decline in quality of life prior to this admission.  They are leaning towards a shift to a comfort path, however at this time wish to continue current medical interventions over the next  24 to 48 hours to allow for outcomes, and to have out-of-town family have the opportunity to visit with patient  MOST form introduced and Hard Choices booklet left for review   Natural trajectory and expectations at EOL were discussed.    Questions and concerns addressed.  Patient encouraged to call with questions or concerns.     PMT will continue to support holistically.          Son Joh Rao reports that he  HPOA, encouraged to bring paperwork in for scanning.  All family members work well together in the best interest of the patient       SUMMARY OF RECOMMENDATIONS    Code Status/Advance Care Planning: Limited code   Symptom Management:  Per attending  Palliative Prophylaxis:  Delirium Protocol  and Frequent Pain Assessment  Additional Recommendations (Limitations, Scope, Preferences): No Artificial Feeding  Psycho-social/Spiritual:  Desire for further Chaplaincy support:no Additional Recommendations: Education on Hospice  Prognosis:  Unable to determine-until goals of care are clearly delineated  Discharge Planning: To Be Determined      Primary Diagnoses: Present on Admission:  ICH (intracerebral hemorrhage) (HCC)   I have reviewed the medical record, interviewed the patient and family, and examined the patient. The following aspects are pertinent.  Past Medical History:  Diagnosis Date   Atrial fibrillation (HCC)    Dementia (HCC)    High cholesterol    Hypertension    Social History   Socioeconomic History   Marital status: Divorced    Spouse name: Not on file   Number of children: Not on file   Years of education: Not on file   Highest education level: Not on file  Occupational History   Not on file  Tobacco Use   Smoking status: Not on file   Smokeless tobacco: Not on file  Substance and Sexual Activity   Alcohol use: Not on file   Drug use: Not on file   Sexual activity: Not on file  Other Topics Concern   Not on file  Social History Narrative   Not on file   Social Drivers of Health   Financial Resource Strain: Low Risk  (01/02/2023)   Received from [facility]   Overall Financial Resource Strain (CARDIA)    Difficulty of Paying Living Expenses: Not hard at all  Food Insecurity: No Food Insecurity (09/29/2024)   Hunger Vital Sign    Worried About Running Out of Food in the Last Year: Never true    Ran Out of Food in the Last Year: Never true  Transportation Needs: No Transportation Needs (09/29/2024)   PRAPARE - Administrator, Civil Service (Medical): No    Lack of Transportation (Non-Medical): No  Physical Activity: Unknown (07/11/2022)   [facility]   Exercise Vital Sign    On average, how many days  per week do you engage in moderate to strenuous exercise (like a brisk walk)?: 0 days    Minutes of Exercise per Session: Not on file  Stress: No Stress Concern Present (07/11/2022)   Received from [facility] of Occupational Health - Occupational Stress Questionnaire    Feeling of Stress : Only a little  Social Connections: Moderately Isolated (12/24/2023)   Social Connection and Isolation Panel    Frequency of Communication with Friends and Family: More than three times a week    Frequency of Social Gatherings with Friends and Family: More than three times a week  Attends Religious Services: 1 to 4 times per year    Active Member of Clubs or Organizations: No    Attends Banker Meetings: Never    Marital Status: Widowed   No family history on file. Scheduled Meds:  Chlorhexidine Gluconate Cloth  6 each Topical Daily   pantoprazole (PROTONIX) IV  40 mg Intravenous QHS   senna-docusate  1 tablet Oral BID   Continuous Infusions:  cefTRIAXone (ROCEPHIN)  IV     PRN Meds:.acetaminophen  **OR** acetaminophen  (TYLENOL ) oral liquid 160 mg/5 mL **OR** acetaminophen , atropine, mouth rinse Medications Prior to Admission:  Prior to Admission medications   Medication Sig Start Date End Date Taking? Authorizing Provider  amiodarone  (PACERONE ) 200 MG tablet Take 1 tablet (200 mg total) by mouth daily. 12/10/23  Yes Christia Budds, MD  apixaban  (ELIQUIS ) 5 MG TABS tablet Take 5 mg by mouth 2 (two) times daily. 04/07/23  Yes [provider]  aspirin  EC 81 MG tablet Take 1 tablet (81 mg total) by mouth daily. Swallow whole. 12/24/23  Yes Gomes, Adriana, DO  atorvastatin  (LIPITOR) 40 MG tablet Take 40 mg by mouth at bedtime.   Yes [provider]  famotidine  (PEPCID ) 20 MG tablet Take 1 tablet (20 mg total) by mouth daily. 12/24/23 09/29/25 Yes Janna Ferrier, DO  folic acid  (FOLVITE ) 1 MG tablet Take 0.5 tablets (0.5 mg total) by mouth daily. 12/24/23   Yes Gomes, Adriana, DO  GOODSENSE CLEARLAX 17 GM/SCOOP powder Take 17 g by mouth every evening. 08/08/23  Yes [provider]  GOODSENSE PAIN RELIEF EXTRA ST 500 MG tablet Take 1,000 mg by mouth in the morning and at bedtime. 09/25/24  Yes [provider]  melatonin 3 MG TABS tablet Take 1 tablet (3 mg total) by mouth at bedtime as needed. 12/24/23  Yes Gomes, Adriana, DO  mirtazapine  (REMERON ) 7.5 MG tablet Take 7.5 mg by mouth at bedtime.   Yes [provider]  tamsulosin  (FLOMAX ) 0.4 MG CAPS capsule Take 0.4 mg by mouth at bedtime.   Yes [provider]   Allergies  Allergen Reactions   Hydrochlorothiazide Other (See Comments)    Unknown reaction   Norvasc [Amlodipine] Other (See Comments)    Unknown reaction   Tekturna [Aliskiren] Other (See Comments)    Unknown reaction   Review of Systems  Unable to perform ROS: Acuity of condition    Physical Exam Constitutional:      Appearance: He is ill-appearing.     Comments: Unable to follow commands  Cardiovascular:     Rate and Rhythm: Normal rate.  Pulmonary:     Breath sounds: Normal breath sounds.  Skin:    General: Skin is warm and dry.  Neurological:     Mental Status: He is lethargic.     Vital Signs: BP 131/70   Pulse (!) 39   Temp 99.5 F (37.5 C) (Axillary)   Resp 13   Ht 5' 10 (1.778 m)   Wt 60.4 kg   SpO2 97%   BMI 19.11 kg/m  Pain Scale: PAINAD       SpO2: SpO2: 97 % O2 Device:SpO2: 97 % O2 Flow Rate: .O2 Flow Rate (L/min): 2 L/min  IO: Intake/output summary:  Intake/Output Summary (Last 24 hours) at 09/30/2024 1258 Last data filed at 09/30/2024 1226 Gross per 24 hour  Intake 567.14 ml  Output 1625 ml  Net -1057.86 ml    LBM: Last BM Date : 09/29/24 Baseline Weight: Weight: 62.2 kg Most  recent weight: Weight: 60.4 kg     Palliative Assessment/Data:    Time:  75 minutes   Discussed with treatment team and bedside RN via secure chat  Signed by: Ronal Plants, NP   Please contact Palliative Medicine Team phone at [phone] for questions and concerns.  For individual provider: See Tracey

## 2024-09-30 NOTE — Plan of Care (Signed)
  Problem: Self-Care: Goal: Ability to communicate needs accurately will improve Outcome: Not Progressing   Problem: Nutrition: Goal: Adequate nutrition will be maintained Outcome: Not Progressing   Problem: Nutritional: Goal: Maintenance of adequate nutrition will improve Outcome: Not Progressing   Problem: Coping: Goal: Will identify appropriate support needs Outcome: Progressing   Problem: Health Behavior/Discharge Planning: Goal: Goals will be collaboratively established with patient/family Outcome: Progressing   Problem: Clinical Measurements: Goal: Ability to maintain clinical measurements within normal limits will improve Outcome: Progressing Goal: Cardiovascular complication will be avoided Outcome: Progressing   Problem: Coping: Goal: Level of anxiety will decrease Outcome: Progressing   Problem: Elimination: Goal: Will not experience complications related to bowel motility Outcome: Progressing Goal: Will not experience complications related to urinary retention Outcome: Progressing   Problem: Pain Managment: Goal: General experience of comfort will improve and/or be controlled Outcome: Progressing

## 2024-09-30 NOTE — Evaluation (Signed)
 Clinical/Bedside Swallow Evaluation Patient Details  Name: Adam Stephens MRN: [id number] Date of Birth: [birth year]  Today's Date: 09/30/2024 Time: SLP Start Time (ACUTE ONLY): 1002 SLP Stop Time (ACUTE ONLY): 1016 SLP Time Calculation (min) (ACUTE ONLY): 14 min  Past Medical History:  Past Medical History:  Diagnosis Date   Atrial fibrillation (HCC)    Dementia (HCC)    High cholesterol    Hypertension    Past Surgical History: No past surgical history on file. HPI:  78 yo presenting from memory care unit as a code stroke seizing. NIH on admission 26. 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 MRI showed small intra-axial hemorrhage centered at the right midbrain with intraventricular extension, not significantly changed from head CT. PMH includes: stroke (significant residual dysarthria; per SLP evals in February 2025, pt able to participate in baseic social conversation, recognize family members, follow simple commands, and name/discriminate basic objects, swallow eval recommended that he stay on his baseline Dys 1 diet and nectar thick liquids but offering water protocol to encourage hydration; family not interested in MBS or alternative feeding at that time), dementia, Afib on eliquis , HTN, HLD    Assessment / Plan / Recommendation  Clinical Impression  Pt is too lethargic to initiate POs at this time, especially when considering his baseline cognitive and swallowing difficulties. Recommend that he remain NPO with additional SLP f/u if more alert to assess for potential to resume PO diet.   Per history in chart, pt has a h/o dysphagia, and per RN, family says he is still on a pureed diet (RN note sure if he is still on thickened liquids as was previously recommended). Pt does not open his eyes but responses a little primarily to tactile stimulation. A few vocalizations were noted, and he parted his lips a little bit in response to the brush more than a spoon. He does not initiate getting boluses  into his oral cavity despite cueing and attempts at different consistencies/gustatory stimulation. He will need to be more alert before POs can be considered.   SLP Visit Diagnosis: Dysphagia, unspecified (R13.10)    Aspiration Risk       Diet Recommendation NPO    Medication Administration: Via alternative means    Other  Recommendations Oral Care Recommendations: Oral care QID Caregiver Recommendations: Have oral suction available     Assistance Recommended at Discharge    Functional Status Assessment Patient has had a recent decline in their functional status and demonstrates the ability to make significant improvements in function in a reasonable and predictable amount of time.  Frequency and Duration min 2x/week  2 weeks       Prognosis Prognosis for improved oropharyngeal function: Fair Barriers to Reach Goals: Cognitive deficits;Severity of deficits      Swallow Study   General HPI: 78 yo presenting from memory care unit as a code stroke seizing. NIH on admission 26. 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 MRI showed small intra-axial hemorrhage centered at the right midbrain with intraventricular extension, not significantly changed from head CT. PMH includes: stroke (significant residual dysarthria; per SLP evals in February 2025, pt able to participate in baseic social conversation, recognize family members, follow simple commands, and name/discriminate basic objects, swallow eval recommended that he stay on his baseline Dys 1 diet and nectar thick liquids but offering water protocol to encourage hydration; family not interested in MBS or alternative feeding at that time), dementia, Afib on eliquis , HTN, HLD Type of Study: Bedside Swallow  Evaluation Previous Swallow Assessment: see HPI Diet Prior to this Study: NPO Temperature Spikes Noted: Yes (100.5) Respiratory Status: Nasal cannula History of Recent Intubation: No Behavior/Cognition: Lethargic/Drowsy;Doesn't follow  directions Oral Cavity Assessment: Other (comment) (UTA) Oral Care Completed by SLP: Yes Oral Cavity - Dentition: Other (Comment) (UTA) Vision:  (keeps eyes closed) Self-Feeding Abilities: Total assist Patient Positioning: Upright in bed Baseline Vocal Quality: Normal (heard in spontaneous vocalizations only) Volitional Cough: Cognitively unable to elicit Volitional Swallow: Unable to elicit    Oral/Motor/Sensory Function Overall Oral Motor/Sensory Function: Other (comment) (not following commands for direct assessment)   Ice Chips Ice chips: Impaired Presentation: Spoon Oral Phase Impairments: Poor awareness of bolus   Thin Liquid Thin Liquid: Impaired Presentation: Spoon Oral Phase Impairments: Poor awareness of bolus    Nectar Thick Nectar Thick Liquid: Not tested   Honey Thick Honey Thick Liquid: Not tested   Puree Puree: Impaired Presentation: Spoon Oral Phase Impairments: Poor awareness of bolus   Solid     Solid: Not tested      Leita SAILOR., M.A. CCC-SLP Acute Rehabilitation Services Office: [phone]  Secure chat preferred  09/30/2024,10:25 AM

## 2024-10-01 DIAGNOSIS — R0609 Other forms of dyspnea: Secondary | ICD-10-CM

## 2024-10-01 DIAGNOSIS — I129 Hypertensive chronic kidney disease with stage 1 through stage 4 chronic kidney disease, or unspecified chronic kidney disease: Secondary | ICD-10-CM | POA: Diagnosis not present

## 2024-10-01 DIAGNOSIS — T45515A Adverse effect of anticoagulants, initial encounter: Secondary | ICD-10-CM | POA: Diagnosis not present

## 2024-10-01 DIAGNOSIS — I619 Nontraumatic intracerebral hemorrhage, unspecified: Secondary | ICD-10-CM

## 2024-10-01 DIAGNOSIS — I615 Nontraumatic intracerebral hemorrhage, intraventricular: Secondary | ICD-10-CM | POA: Diagnosis not present

## 2024-10-01 DIAGNOSIS — I610 Nontraumatic intracerebral hemorrhage in hemisphere, subcortical: Secondary | ICD-10-CM | POA: Diagnosis not present

## 2024-10-01 DIAGNOSIS — R52 Pain, unspecified: Secondary | ICD-10-CM

## 2024-10-01 LAB — BASIC METABOLIC PANEL WITH GFR
Anion gap: 13 (ref 5–15)
BUN: 22 mg/dL (ref 8–23)
CO2: 20 mmol/L — ABNORMAL LOW (ref 22–32)
Calcium: 9.3 mg/dL (ref 8.9–10.3)
Chloride: 111 mmol/L (ref 98–111)
Creatinine, Ser: 1.48 mg/dL — ABNORMAL HIGH (ref 0.61–1.24)
GFR, Estimated: 48 mL/min — ABNORMAL LOW (ref >60)
Glucose, Bld: 86 mg/dL (ref 70–99)
Potassium: 3.4 mmol/L — ABNORMAL LOW (ref 3.5–5.1)
Sodium: 144 mmol/L (ref 135–145)

## 2024-10-01 LAB — CBC
HCT: 36.2 % — ABNORMAL LOW (ref 39.0–52.0)
Hemoglobin: 11.9 g/dL — ABNORMAL LOW (ref 13.0–17.0)
MCH: 33.3 pg (ref 26.0–34.0)
MCHC: 32.9 g/dL (ref 30.0–36.0)
MCV: 101.4 fL — ABNORMAL HIGH (ref 80.0–100.0)
Platelets: 340 K/uL (ref 150–400)
RBC: 3.57 MIL/uL — ABNORMAL LOW (ref 4.22–5.81)
RDW: 14.7 % (ref 11.5–15.5)
WBC: 15.4 K/uL — ABNORMAL HIGH (ref 4.0–10.5)
nRBC: 0 % (ref 0.0–0.2)

## 2024-10-01 LAB — GLUCOSE, CAPILLARY
Glucose-Capillary: 79 mg/dL (ref 70–99)
Glucose-Capillary: 85 mg/dL (ref 70–99)

## 2024-10-01 MED ORDER — HALOPERIDOL LACTATE 2 MG/ML PO CONC: 0.5000 mg | ORAL | Status: DC | PRN

## 2024-10-01 MED ORDER — GLYCOPYRROLATE 0.2 MG/ML IJ SOLN: 0.2000 mg | INTRAMUSCULAR | Status: DC | PRN

## 2024-10-01 MED ORDER — BIOTENE DRY MOUTH MT LIQD: 15.0000 mL | OROMUCOSAL | Status: DC | PRN

## 2024-10-01 MED ORDER — HALOPERIDOL 0.5 MG PO TABS: 0.5000 mg | ORAL_TABLET | ORAL | Status: DC | PRN

## 2024-10-01 MED ORDER — ACETAMINOPHEN 325 MG PO TABS: 650.0000 mg | ORAL_TABLET | Freq: Four times a day (QID) | ORAL | Status: DC | PRN

## 2024-10-01 MED ORDER — ACETAMINOPHEN 650 MG RE SUPP
650.0000 mg | Freq: Four times a day (QID) | RECTAL | Status: DC | PRN
Start: 2024-10-01 — End: 2024-10-02

## 2024-10-01 MED ORDER — MORPHINE SULFATE (PF) 2 MG/ML IV SOLN
1.0000 mg | INTRAVENOUS | Status: DC | PRN
  Administered 2024-10-01 – 2024-10-02 (×5): 1 mg via INTRAVENOUS
  Filled 2024-10-01 (×5): qty 1

## 2024-10-01 MED ORDER — ONDANSETRON 4 MG PO TBDP: 4.0000 mg | ORAL_TABLET | Freq: Four times a day (QID) | ORAL | Status: DC | PRN

## 2024-10-01 MED ORDER — GLYCOPYRROLATE 0.2 MG/ML IJ SOLN
0.2000 mg | INTRAMUSCULAR | Status: DC | PRN
Start: 2024-10-01 — End: 2024-10-02

## 2024-10-01 MED ORDER — ONDANSETRON HCL 4 MG/2ML IJ SOLN: 4.0000 mg | Freq: Four times a day (QID) | INTRAMUSCULAR | Status: DC | PRN

## 2024-10-01 MED ORDER — LORAZEPAM 2 MG/ML IJ SOLN
1.0000 mg | INTRAMUSCULAR | Status: DC | PRN
  Administered 2024-10-01 – 2024-10-02 (×4): 1 mg via INTRAVENOUS
  Filled 2024-10-01 (×4): qty 1

## 2024-10-01 MED ORDER — POLYVINYL ALCOHOL 1.4 % OP SOLN: 1.0000 [drp] | Freq: Four times a day (QID) | OPHTHALMIC | Status: DC | PRN

## 2024-10-01 MED ORDER — GLYCOPYRROLATE 1 MG PO TABS: 1.0000 mg | ORAL_TABLET | ORAL | Status: DC | PRN

## 2024-10-01 MED ORDER — POTASSIUM CHLORIDE 10 MEQ/100ML IV SOLN: 10.0000 meq | INTRAVENOUS | Status: DC

## 2024-10-01 MED ORDER — HALOPERIDOL LACTATE 5 MG/ML IJ SOLN: 0.5000 mg | INTRAMUSCULAR | Status: DC | PRN

## 2024-10-01 NOTE — Progress Notes (Addendum)
 No charge note:  Per Rn and wife, patient was transitioned to comfort care but NIHSS and Tele were not discontinued.  I spoke with wife at the bedside and she confirmed transition to comfort care. I will discontinue NIHSS and tele orders.  Adam Stephens [facility]

## 2024-10-01 NOTE — Progress Notes (Signed)
 Patient ID: TRELYN VANDERLINDE, male   DOB: 1946/06/06, 78 y.o.   MRN: [id number]    Progress Note from the Palliative Medicine Team at [facility]   Patient Name: Adam Stephens        Date: 10/01/2024 DOB: 07/04/1946  Age: 78 y.o. MRN#: [id number] Attending Physician: Stroke, Md, MD Primary Care Physician: Pcp, No Admit Date: 09/28/2024   Reason for Consultation/Follow-up   Establishing Goals of Care   HPI/ Brief Hospital Review  78 y.o. male   admitted on 09/28/2024 with  PMH for HTN, HLD, A fib on Eliquis  presents from memory care  with AMS and possible seizure.     He was found to have ICH, maintaining his airways and anticoagulation revereed and he was loaded with Keppra. PCCM called as he has been persistently hypotensive and bradycardia to HR high 30's. UA consistent with UTI.    Neuro exams remains poor. No command following. Does not open eyes to voice/noxious stimuli. Moves BLE spontaneously.    Admitted for treatment and stabilization.  Family face treatment option decisions, advanced directive decisions and anticipatory care needs.     Subjective  Extensive chart review has been completed prior to meeting with patient/family  including labs, vital signs, imaging, progress/consult notes, orders, medications and available advance directive documents.    This NP assessed patient at the bedside as a follow up for palliative medicine needs and emotional support.  Patient's significant other of 35 years Nathanel at bedside along with patient's daughter Niels Abercrombie# [phone].  Ongoing conversation regarding current medical situation.  As discussed yesterday all family agree that a comfort path for Mr. Faraone foregoing life-prolonging measures and allowing for natural death is in his best interest at this time in his life.  Education offered on hospice benefit; philosophy and eligibility.  We discussed specifically inpatient hospice eligibility and services.  Family  would like to [facility] a [facility] at [facility] [place].  Patient's significant other has done extensive volunteer work for them.      ( I spoke to hospice liaison at [facility] [place] in the afternoon and she verified that patient has been accepted but that there is no bed available today and they will follow-up again tomorrow, I notified Nathanel and she appreciated the update)  Education offered on the natural trajectory and expectations of end-of-life.  Education offered on the utilization of medication for symptom management and end-of-life   - Morphine 1 mg IV every 1 hour as needed for pain or dyspnea   - Ativan 1 mg IV every 4 hours as needed for agitation, or anxiety  Questions and concerns addressed   Discussed with primary team and nursing staff   Time:  55 minutes  Detailed review of medical records ( labs, imaging, vital signs), medically appropriate exam ( MS, skin, cardiac,  resp)   discussed with treatment team, counseling and education to patient, family, staff, documenting clinical information, medication management, coordination of care    Ronal Plants NP  Palliative Medicine Team Team Phone # [phone] Pager [phone]

## 2024-10-01 NOTE — Discharge Summary (Addendum)
 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, etiology: likely hypertensive in the setting of Eliquis , reversed with Andexxa   Secondary diagnosis Seizure-like activity A-fib Bradycardia Hypertension UTI AKI Dysphagia Dementia   Allergies as of 10/02/2024       Reactions   Hydrochlorothiazide Other (See Comments)   Unknown reaction   Norvasc [amlodipine] Other (See Comments)   Unknown reaction   Tekturna [aliskiren] Other (See Comments)   Unknown reaction        Medication List     STOP taking these medications    amiodarone  200 MG tablet Commonly known as: PACERONE    apixaban  5 MG Tabs tablet Commonly known as: ELIQUIS    aspirin  EC 81 MG tablet   atorvastatin  40 MG tablet Commonly known as: LIPITOR   famotidine  20 MG tablet Commonly known as: PEPCID    folic acid  1 MG tablet Commonly known as: FOLVITE    GoodSense ClearLax 17 GM/SCOOP powder Generic drug: polyethylene glycol powder   melatonin 3 MG Tabs tablet   mirtazapine  7.5 MG tablet Commonly known as: REMERON    tamsulosin  0.4 MG Caps capsule Commonly known as: FLOMAX        TAKE these medications    acetaminophen  325 MG tablet Commonly known as: TYLENOL  Take 2 tablets (650 mg total) by mouth every 6 (six) hours as needed for mild pain (pain score 1-3) or fever (or Fever >/= 101). What changed:  medication strength how much to take when to take this reasons to take this   antiseptic oral rinse Liqd Apply 15 mLs topically as needed for dry mouth.   artificial tears ophthalmic solution Place 1 drop into both eyes 4 (four) times daily as needed for dry  eyes.   glycopyrrolate 1 MG tablet Commonly known as: ROBINUL Take 1 tablet (1 mg total) by mouth every 4 (four) hours as needed (excessive secretions).   haloperidol 0.5 MG tablet Commonly known as: HALDOL Take 1 tablet (0.5 mg total) by mouth every 4 (four) hours as needed for agitation (or delirium).   LORazepam 2 MG/ML injection Commonly known as: ATIVAN Inject 0.5 mLs (1 mg total) into the vein every 4 (four) hours as needed for anxiety or seizure.   morphine (PF) 2 MG/ML injection Inject 0.5 mLs (1 mg total) into the vein every hour as needed (dyspnea).   ondansetron 4 MG disintegrating tablet Commonly known as: ZOFRAN-ODT Take 1 tablet (4 mg total) by mouth every 6 (six) hours as needed for nausea.        LABORATORY STUDIES CBC    Component Value Date/Time   WBC 15.4 (H) 10/01/2024 0136   RBC 3.57 (L) 10/01/2024 0136   HGB 11.9 (L) 10/01/2024 0136   HCT 36.2 (L) 10/01/2024 0136   PLT 340 10/01/2024 0136   MCV 101.4 (H) 10/01/2024 0136   MCH 33.3 10/01/2024 0136   MCHC 32.9 10/01/2024 0136   RDW 14.7 10/01/2024 0136   LYMPHSABS 2.1 09/28/2024 2253   MONOABS 1.0 09/28/2024 2253   EOSABS 0.5 09/28/2024 2253   BASOSABS 0.1 09/28/2024 2253   CMP    Component Value Date/Time   NA 144  10/01/2024 0136   K 3.4 (L) 10/01/2024 0136   CL 111 10/01/2024 0136   CO2 20 (L) 10/01/2024 0136   GLUCOSE 86 10/01/2024 0136   BUN 22 10/01/2024 0136   CREATININE 1.48 (H) 10/01/2024 0136   CALCIUM  9.3 10/01/2024 0136   PROT 6.3 (L) 09/28/2024 2253   ALBUMIN 2.9 (L) 09/28/2024 2253   AST 15 09/28/2024 2253   ALT 10 09/28/2024 2253   ALKPHOS 63 09/28/2024 2253   BILITOT 0.3 09/28/2024 2253   GFRNONAA 48 (L) 10/01/2024 0136   COAGS Lab Results  Component Value Date   INR 1.3 (H) 09/28/2024   INR 1.4 (H) 12/22/2023   Lipid Panel    Component Value Date/Time   CHOL 113 09/29/2024 0617   TRIG 74 09/29/2024 0617   HDL 47 09/29/2024 0617   CHOLHDL 2.4 09/29/2024 0617    VLDL 15 09/29/2024 0617   LDLCALC 51 09/29/2024 0617   HgbA1C  Lab Results  Component Value Date   HGBA1C 5.2 09/29/2024   Alcohol Level    Component Value Date/Time   [facility] <15 09/28/2024 2253     SIGNIFICANT DIAGNOSTIC STUDIES MR BRAIN W WO CONTRAST Result Date: 09/30/2024 EXAM: MRI BRAIN WITH AND WITHOUT CONTRAST 09/30/2024 05:48:00 AM TECHNIQUE: Multiplanar multisequence MRI of the head/brain was performed with and without the administration of intravenous contrast. COMPARISON: Head CT 09/29/2024 and earlier, and brain MRI 12/22/2023. CLINICAL HISTORY: 78 year old male with acute neuro deficit, stroke suspected. CT shows small acute hemorrhage at right thalamus/midbrain junction. FINDINGS: BRAIN AND VENTRICLES: T1 isointense and T2 heterogeneous mostly iso to low signal intensity blood products redemonstrated at the right midbrain and extending into the adjacent 3rd ventricle. Small volume of hemorrhage layering in the occipital horns, greater on the left. Blood product size and configuration not significantly changed from head CT yesterday. Intraaxial hemorrhage appears centered at the right midbrain, with intraventricular extension not significantly changed from head CT yesterday. SWI demonstrates numerous additional bilateral thalamic chronic microhemorrhages (series 14 image 35) and an intermediate number of scattered cerebral hemispheres chronic microhemorrhages also. Evidence of chronic blood products also in the deep cerebellar nuclei, more so the left (series 14 image 17). At this time the extent does not rise to the level of amyloid angiopathy. There has been mild progression since the February MRI (left deep cerebellar nucleus in particular). Chronic nonspecific ventriculomegaly. Evans index 0.4 and callosal angle 49 degrees. Extensive chronic periventricular and other cerebral white matter T2 and FLAIR hyperintensity. No strong evidence of transependymal edema. Chronic lacunar  infarcts in the bilateral corona radiata, bilateral deep gray nuclei, and left pons. Multiple small foci of periventricular white matter restricted diffusion along the bodies of the lateral ventricles (more numerous on the left, series 9 image 70) in keeping with acute periventricular white matter lacunar infarcts. Superimposed multiple punctate acute periventricular white matter lacunar infarcts more numerous on the left along the superior lateral ventricles, without associated hemorrhage or mass effect. Otherwise, DWI artifact is associated with the acute blood products. No midline shift. Basilar cisterns remain patent. Following contrast, no abnormal enhancement. The sella is unremarkable. Normal flow voids. ORBITS: No acute abnormality. SINUSES: No acute abnormality. BONES AND SOFT TISSUES: Normal bone marrow signal and enhancement. No acute soft tissue abnormality. IMPRESSION: 1. Small intra-axial hemorrhage centered at the right midbrain with intraventricular extension, not significantly changed from head CT yesterday. 2. Underlying other advanced Acute and chronic small vessel disease: (1) Superimposed multiple punctate acute periventricular white matter lacunar  infarcts along the superior lateral ventricles, without associated hemorrhage or mass effect. And (2) numerous chronic microhemorrhages in the brain which have mildly progressed since February of this year, but currently does not rise to the level of amyloid angiopathy. 3. Chronic nonspecific ventriculomegaly, (Evans index 0.4 and callosal angle 49 degrees). Superimposed normal pressure hydrocephalus cannot be excluded. Electronically signed by: Adam Hurst MD 09/30/2024 06:14 AM EST RP Workstation: [id number]   Overnight EEG with video Result Date: 09/30/2024 Adam Adam KIDD, MD     09/30/2024 12:00 PM Patient Name: Adam Stephens MRN: [id number] Epilepsy Attending: Arlin Stephens Adam Referring Physician/Provider: Khaliqdina, Salman, MD Duration:  09/29/2024 0803 to 09/30/2024 1019 Patient history: 78 y.o. male with history of Afib on eliquis , HTN, HLD who initially presented as a code stroke seizing. EEG to evaluate for seizure Level of alertness: comatose/ lethargic AEDs during EEG study: None Technical aspects: This EEG study was done with scalp electrodes positioned according to the 10-20 International system of electrode placement. Electrical activity was reviewed with band pass filter of 1-70Hz , sensitivity of 7 uV/mm, display speed of 30mm/sec with a 60Hz  notched filter applied as appropriate. EEG data were recorded continuously and digitally stored.  Video monitoring was available and reviewed as appropriate. Description: EEG showed continuous generalized 3 to 6 Hz theta-delta slowing admixed with 12-15hz  beta activity. Hyperventilation and photic stimulation were not performed.  EEG was disconnected between 09/30/2024 0448 to  0547 for MRI brain ABNORMALITY - Continuous slow, generalized IMPRESSION: This study is suggestive of generalized cerebral dysfunction (encephalopathy). No seizures or epileptiform discharges were seen throughout the recording. Adam Stephens   CT HEAD POST STROKE FOLLOWUP/TIMED/STAT READ Result Date: 09/29/2024 EXAM: CT HEAD WITHOUT 09/29/2024 05:42:01 AM TECHNIQUE: CT of the head was performed without the administration of intravenous contrast. Automated exposure control, iterative reconstruction, and/or weight based adjustment of the mA/kV was utilized to reduce the radiation dose to as low as reasonably achievable. COMPARISON: Head CT and CTA head and neck 09/28/2024. Brain MRI 12/22/2023. CLINICAL HISTORY: 78 year old male. Code stroke presentation yesterday. Neuro deficit, acute, stroke suspected. Medial right thalamic hemorrhage with intraventricular extension. FINDINGS: BRAIN AND VENTRICLES: Small intraaxial hemorrhage at the junction of the right thalamus and midbrain, cerebral peduncle on series 2 image 21.  Rounded intraaxial blood there estimated at 10 mm diameter (1 ml). Stable small to moderate volume of intraventricular extension of blood, mostly the 3rd ventricle, smaller volume scattered in the left lateral ventricle. Underlying chronic ventriculomegaly (nonspecific) is stable. Underlying advanced chronic small vessel disease redemonstrated including multifocal bilateral deep gray nuclei chronic hypodensity, confluent bilateral cerebral white matter hypodensity. Chronic heterogeneity in the brainstem. No acute intracranial mass effect or midline shift. No extra-axial fluid collection. No evidence of acute infarct. No superimposed acute cortically based infarct by CT. No hydrocephalus. No new areas of intracranial hemorrhage. Calcified atherosclerosis at the skull base. No suspicious intracranial vascular hyperdensity. ORBITS: No acute abnormality. SINUSES AND MASTOIDS: Paranasal sinuses, middle ears and mastoids remain well aerated. SOFT TISSUES AND SKULL: No acute skull fracture. No acute soft tissue abnormality. Stable chronic left facial bone fractures. IMPRESSION: 1. No significant change in Right thalamic-midbrain junction parenchymal hemorrhage ( estimated 1 mL) and small-to-moderate volume intraventricular extension. 2. Underlying chronic nonspecific ventriculomegaly is stable. And underlying advanced chronic small vessel disease. 3. No new intracranial abnormality. Electronically signed by: Adam Hurst MD 09/29/2024 05:53 AM EST RP Workstation: [id number]   CT ANGIO HEAD NECK W WO CM (CODE STROKE) Result Date:  09/28/2024 EXAM: CTA HEAD AND NECK WITH/WITH AND WITHOUT _study_datetime_ TECHNIQUE: CTA of the head and neck was performed with/with and without the administration of intravenous contrast. Multiplanar 2D and/or 3D reformatted images are provided for review. Automated exposure control, iterative reconstruction, and/or weight based adjustment of the mA/kV was utilized to reduce the radiation dose  to as low as reasonably achievable. Stenosis of the internal carotid arteries measured using NASCET criteria. ## Instructions (for model only, do not include in report) TASK: If findings are described in the transcript for a unilateral specific cervical artery (e.g. left internal carotid artery or ICA), please do the following: - Keep negative statements for other vessels in the relevant section. - Modify the relevant negative statement to reflect that the contralateral artery (e.g. right ICA) is patent without significant stenosis or dissection. TASK: If findings are described in the transcript for a unilateral specific intracranial artery (e.g. left middle cerebral artery or left MCA) in the transcript, please do the following: - Keep negative statements for other vessels in the relevant section. - Modify the relevant negative statement to reflect that the contralateral artery (e.g. right MCA) is patent proximally without significant stenosis. COMPARISON: None available CLINICAL HISTORY: FINDINGS: CTA NECK: AORTIC ARCH AND ARCH VESSELS: No dissection or arterial injury. No significant stenosis of the brachiocephalic or subclavian arteries. CERVICAL CAROTID ARTERIES: Atherosclerosis of the right carotid bifurcation with approximately 70% stenosis of the ICA origin. No significant (Greater than 50%) stenosis of the left carotid. CERVICAL VERTEBRAL ARTERIES: No dissection, arterial injury, or significant stenosis. LUNGS AND MEDIASTINUM: Unremarkable. SOFT TISSUES: No acute abnormality. BONES: No acute abnormality. CTA HEAD: ANTERIOR CIRCULATION: No significant stenosis of the internal carotid arteries. No significant stenosis of the anterior cerebral arteries. 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 No aneurysm. POSTERIOR CIRCULATION: No significant stenosis of the posterior cerebral arteries. Moderate right and mild left P2 PCA stenosis. No significant stenosis of the basilar artery. No significant  stenosis of the vertebral arteries. No aneurysm. OTHER: Dural venous sinuses are not well evaluated. IMPRESSION: 1. No large vessel occlusion. 2. No evidence of aneurysm. 3. Approximately 70% stenosis of the ICA origin. 4. 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 5. Moderate right and mild left P2 PCA stenosis. Electronically signed by: Adam Molt MD 09/28/2024 11:28 PM EST RP Workstation: [id number]   CT HEAD CODE STROKE WO CONTRAST Result Date: 09/28/2024 EXAM: CT HEAD WITHOUT CONTRAST 09/28/2024 11:04:56 PM TECHNIQUE: CT of the head was performed without the administration of intravenous contrast. Automated exposure control, iterative reconstruction, and/or weight based adjustment of the mA/kV was utilized to reduce the radiation dose to as low as reasonably achievable. COMPARISON: CT head 12/22/2023 CLINICAL HISTORY: Neuro deficit, acute, stroke suspected. FINDINGS: BRAIN AND VENTRICLES: Acute 1 cm intraparenchymal hemorrhage in the medial right thalamus with intraventricular extension into the left lateral and third ventricles. Chronic ventriculomegaly and cerebral atrophy is unchanged. Remote bilateral deep gray nuclei lacunar infarcts. No extra-axial collection. No mass effect or midline shift. Patchy white matter hypodensities, nonspecific but compatible with chronic vascular ischemic disease. ORBITS: No acute abnormality. SINUSES: No acute abnormality. SOFT TISSUES AND SKULL: No acute soft tissue abnormality. No skull fracture. Findings discussed with Dr. Sal via telephone at 11:07 PM IMPRESSION: 1. Acute 1 cm intraparenchymal hemorrhage in the medial right thalamus with intraventricular extension into the left lateral and third ventricles. 2. Chronic ventriculomegaly and cerebral atrophy is unchanged. 3. Chronic microvascular ischemic change without evidence of acute  large vascular territory infarct. ASPECTS 10. Electronically signed by: Adam Molt MD 09/28/2024 11:12 PM EST RP  Workstation: [id number]       HISTORY OF PRESENT ILLNESS 78 y.o. patient with history of A-fib on Eliquis , hypertension and hyperlipidemia was admitted with seizure activity.  HOSPITAL COURSE Patient was found to have a right thalamic ICH with extension into the midbrain and IVH, and Eliquis  was reversed with Andexxa.  He did have some twitching of the left upper and lower extremities on presentation and was loaded with Keppra.  LTM EEG demonstrated no further seizure activity after that.  After goals of care discussion with family, decision was made to transition patient to comfort care and transfer to inpatient hospice.  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  Right ICA bulb 70% stenosis CT repeat stable ICH and IVH MRI Small intra-axial hemorrhage centered at the right midbrain with intraventricular extension, not significantly changed from head CT yesterday. 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 No seizures or epileptiform discharges were seen throughout the recording.  12/2023 - 2D Echo EF 60-65%  LDL 52 HgbA1c 5.2 VTE prophylaxis -none for comfort aspirin  81 mg daily and Eliquis  (apixaban ) daily prior to admission, now on No antithrombotic  Disposition: Inpatient hospice   Seizure like activity versus myoclonus Left upper and lower extremity twitching on presentation Was loaded with Keppra 60 mg/kg No clinical seizure since LTM EEG no seizure- d/c   Atrial fibrillation Home Meds: Eliquis , amiodarone  Was reversed  with Andexxa No AC for now due to ICH and complicated migraines   Bradycardia One dose of atropine given by EMS PCCM Consulted Correlates with his apnea HR 30s-50s   Hypertension Home meds none BP high on presentation, now stable on the low end BP goal less than 160 Long-term BP goal normotensive   Complicated UTI AKI on CKD 3a Antibiotics discontinued as patient is on comfort care Tmax 100.5 creatinine 2.00-1.65-1.57-1.48 Leukocytosis WBC 13.7-15.7-12.9 -15.4   Dysphagia Patient has post-stroke dysphagia No artifical feeding at this time as patient is on comfort care   Other Active Problems Dementia mRs: 4 Lives in memory care facility    DISCHARGE EXAM  PHYSICAL EXAM General: Ill-appearing elderly patient in no acute distress Respiratory:  Regular, unlabored respirations on room air   NEURO:  Patient is resting in bed comfortably, with no spontaneous movement and does not respond to name or follow commands.   Discharge Diet       There are no active orders of the following types: Diet, Nourishments.   liquids  DISCHARGE PLAN Disposition: Inpatient hospice Follow up with hospice provider  35 minutes were spent preparing discharge.  Adam Stephens , MSN, AGACNP-BC [facility] See Amion for schedule and pager information 10/02/2024 12:15 PM  ATTENDING NOTE: I reviewed above note and agree with the assessment and plan.  Family requested comfort care measures yesterday, currently patient in comfort care measures, no distress.  On morphine as needed.  Transferred to inpatient hospice facility.  For detailed assessment and plan,  please refer to above as I have made changes wherever appropriate.   Ary Cummins, MD PhD Stroke Neurology 10/02/2024 6:48 PM

## 2024-10-01 NOTE — Progress Notes (Addendum)
 STROKE TEAM PROGRESS NOTE   INTERIM HISTORY/SUBJECTIVE Patient is seen in his room with 2 family members at the bedside.  Goals of care conversation has been completed, and family has decided to transfer patient to inpatient hospice.  OBJECTIVE  CBC    Component Value Date/Time   WBC 15.4 (H) 10/01/2024 0136   RBC 3.57 (L) 10/01/2024 0136   HGB 11.9 (L) 10/01/2024 0136   HCT 36.2 (L) 10/01/2024 0136   PLT 340 10/01/2024 0136   MCV 101.4 (H) 10/01/2024 0136   MCH 33.3 10/01/2024 0136   MCHC 32.9 10/01/2024 0136   RDW 14.7 10/01/2024 0136   LYMPHSABS 2.1 09/28/2024 2253   MONOABS 1.0 09/28/2024 2253   EOSABS 0.5 09/28/2024 2253   BASOSABS 0.1 09/28/2024 2253    BMET    Component Value Date/Time   NA 144 10/01/2024 0136   K 3.4 (L) 10/01/2024 0136   CL 111 10/01/2024 0136   CO2 20 (L) 10/01/2024 0136   GLUCOSE 86 10/01/2024 0136   BUN 22 10/01/2024 0136   CREATININE 1.48 (H) 10/01/2024 0136   CALCIUM  9.3 10/01/2024 0136   GFRNONAA 48 (L) 10/01/2024 0136    IMAGING past 24 hours No results found.   Vitals:   10/01/24 0000 10/01/24 0346 10/01/24 0756 10/01/24 1139  BP: 94/65 131/72 127/87 112/68  Pulse: 72 70 83 64  Resp: 17 16  18   Temp: 98.6 F (37 C) 97.9 F (36.6 C) 98.1 F (36.7 C) 98 F (36.7 C)  TempSrc: Oral Oral Axillary Oral  SpO2: 98% 99% 100% 95%  Weight:      Height:         PHYSICAL EXAM General:  Alert, well-nourished, well-developed patient in no acute distress Psych:  Mood and affect appropriate for situation CV: Regular rate and rhythm on monitor Respiratory:  Regular, unlabored respirations on room air    NEURO:  Patient appears comfortable, does not respond to voice or light touch.  Moves left upper extremity slightly spontaneously and not purposefully.   ASSESSMENT/PLAN  [name] is a 78 y.o. male with history of Afib on eliquis , HTN, HLD who initially presented as a code stroke seizing.  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  Right ICA bulb 70% stenosis CT repeat stable ICH and IVH MRI Small intra-axial hemorrhage centered at the right midbrain with intraventricular extension, not significantly changed from head CT yesterday. 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 No seizures or epileptiform discharges were seen throughout the recording.  12/2023 - 2D Echo EF 60-65%  LDL 52 HgbA1c 5.2 VTE prophylaxis - SCDs aspirin  81 mg daily and Eliquis  (apixaban ) daily prior to admission, now on No antithrombotic due to ICH and comfort care Disposition: palliative care on board, now family requested Inpatient hospice  Seizure like activity versus myoclonus Left upper and lower extremity twitching on presentation Loaded with Keppra 60 mg/kg No clinical seizure since LTM EEG no seizure- d/c today  Atrial fibrillation Home Meds: Eliquis , amiodarone  Reversed with  Andexxa No AC for now due to ICH and comfort care  Bradycardia One dose of atropine given by EMS HR 30s-50s initially Now improved  Hypertension Home meds none BP high on presentation, now stable on the low end Hydralazine PRN  Complicated UTI AKI on CKD 3 AA Antibiotics discontinued as patient is on comfort care Tmax 100.5 - afebrile creatinine 2.00-1.65-1.57-1.48 Leukocytosis WBC 13.7-15.7-12.9 -15.4  Dysphagia Patient has post-stroke dysphagia  Off OVF No artifical feeding at this time as patient is on comfort care  Other Active  Problems Dementia mRs: 4 Lives in memory care facility   Hospital day # 3  Patient seen and examined by NP/APP with MD. MD to update note as needed.   Adam Stephens Adam Stephens , MSN, AGACNP-BC [facility] See Amion for schedule and pager information 10/01/2024 11:56 AM   ATTENDING NOTE: I reviewed above note and agree with the assessment and plan. Pt was seen and examined.   Family lying in bed, eyes closed, no neuro changes, no acute event overnight. No fever this morning. Palliative care had further GOC discussion with family and they requested comfort care measures. Pending inpt hospice bed.  For detailed assessment and plan, please refer to above as I have made changes wherever appropriate.   Adam Cummins, MD PhD Stroke Neurology 10/01/2024 3:33 PM

## 2024-10-01 NOTE — Care Management Important Message (Signed)
 Important Message  Patient Details  Name: Adam Stephens MRN: [id number] Date of Birth: 06-22-1946   Important Message Given:  Yes - Medicare IM     Claretta Deed 10/01/2024, 2:08 PM

## 2024-10-01 NOTE — Plan of Care (Signed)
 Problem: Education: Goal: Knowledge of disease or condition will improve 10/01/2024 1328 by Jenel Bobetta SAILOR, RN Outcome: Progressing 10/01/2024 1328 by Jenel Bobetta SAILOR, RN Outcome: Progressing Goal: Knowledge of secondary prevention will improve (MUST DOCUMENT ALL) 10/01/2024 1328 by Jenel Bobetta SAILOR, RN Outcome: Progressing 10/01/2024 1328 by Jenel Bobetta SAILOR, RN Outcome: Progressing Goal: Knowledge of patient specific risk factors will improve (DELETE if not current risk factor) 10/01/2024 1328 by Jenel Bobetta SAILOR, RN Outcome: Progressing 10/01/2024 1328 by Jenel Bobetta SAILOR, RN Outcome: Progressing   Problem: Intracerebral Hemorrhage Tissue Perfusion: Goal: Complications of Intracerebral Hemorrhage will be minimized 10/01/2024 1328 by Jenel Bobetta SAILOR, RN Outcome: Progressing 10/01/2024 1328 by Jenel Bobetta SAILOR, RN Outcome: Progressing   Problem: Coping: Goal: Will verbalize positive feelings about self 10/01/2024 1328 by Jenel Bobetta SAILOR, RN Outcome: Progressing 10/01/2024 1328 by Jenel Bobetta SAILOR, RN Outcome: Progressing Goal: Will identify appropriate support needs 10/01/2024 1328 by Jenel Bobetta SAILOR, RN Outcome: Progressing 10/01/2024 1328 by Jenel Bobetta SAILOR, RN Outcome: Progressing   Problem: Health Behavior/Discharge Planning: Goal: Ability to manage health-related needs will improve 10/01/2024 1328 by Jenel Bobetta SAILOR, RN Outcome: Progressing 10/01/2024 1328 by Jenel Bobetta SAILOR, RN Outcome: Progressing Goal: Goals will be collaboratively established with patient/family 10/01/2024 1328 by Jenel Bobetta SAILOR, RN Outcome: Progressing 10/01/2024 1328 by Jenel Bobetta SAILOR, RN Outcome: Progressing   Problem: Self-Care: Goal: Ability to participate in self-care as condition permits will improve 10/01/2024 1328 by Jenel Bobetta SAILOR, RN Outcome: Progressing 10/01/2024 1328 by Jenel Bobetta SAILOR, RN Outcome: Progressing Goal: Verbalization of feelings and concerns over difficulty  with self-care will improve 10/01/2024 1328 by Jenel Bobetta SAILOR, RN Outcome: Progressing 10/01/2024 1328 by Jenel Bobetta SAILOR, RN Outcome: Progressing Goal: Ability to communicate needs accurately will improve 10/01/2024 1328 by Jenel Bobetta SAILOR, RN Outcome: Progressing 10/01/2024 1328 by Jenel Bobetta SAILOR, RN Outcome: Progressing   Problem: Nutrition: Goal: Risk of aspiration will decrease 10/01/2024 1328 by Jenel Bobetta SAILOR, RN Outcome: Progressing 10/01/2024 1328 by Jenel Bobetta SAILOR, RN Outcome: Progressing Goal: Dietary intake will improve 10/01/2024 1328 by Jenel Bobetta SAILOR, RN Outcome: Progressing 10/01/2024 1328 by Jenel Bobetta SAILOR, RN Outcome: Progressing   Problem: Education: Goal: Knowledge of General Education information will improve Description: Including pain rating scale, medication(s)/side effects and non-pharmacologic comfort measures 10/01/2024 1328 by Jenel Bobetta SAILOR, RN Outcome: Progressing 10/01/2024 1328 by Jenel Bobetta SAILOR, RN Outcome: Progressing   Problem: Health Behavior/Discharge Planning: Goal: Ability to manage health-related needs will improve 10/01/2024 1328 by Jenel Bobetta SAILOR, RN Outcome: Progressing 10/01/2024 1328 by Jenel Bobetta SAILOR, RN Outcome: Progressing   Problem: Clinical Measurements: Goal: Ability to maintain clinical measurements within normal limits will improve 10/01/2024 1328 by Jenel Bobetta SAILOR, RN Outcome: Progressing 10/01/2024 1328 by Jenel Bobetta SAILOR, RN Outcome: Progressing Goal: Will remain free from infection 10/01/2024 1328 by Jenel Bobetta SAILOR, RN Outcome: Progressing 10/01/2024 1328 by Jenel Bobetta SAILOR, RN Outcome: Progressing Goal: Diagnostic test results will improve 10/01/2024 1328 by Jenel Bobetta SAILOR, RN Outcome: Progressing 10/01/2024 1328 by Jenel Bobetta SAILOR, RN Outcome: Progressing Goal: Respiratory complications will improve 10/01/2024 1328 by Jenel Bobetta SAILOR, RN Outcome: Progressing 10/01/2024 1328 by Jenel Bobetta SAILOR, RN Outcome: Progressing Goal: Cardiovascular complication will be avoided 10/01/2024 1328 by Jenel Bobetta SAILOR, RN Outcome: Progressing 10/01/2024 1328 by Jenel Bobetta SAILOR, RN Outcome: Progressing   Problem: Activity: Goal: Risk for activity intolerance will decrease 10/01/2024 1328 by Jenel Bobetta SAILOR, RN Outcome: Progressing 10/01/2024 1328 by  Jenel Bobetta SAILOR, RN Outcome: Progressing   Problem: Nutrition: Goal: Adequate nutrition will be maintained 10/01/2024 1328 by Jenel Bobetta SAILOR, RN Outcome: Progressing 10/01/2024 1328 by Jenel Bobetta SAILOR, RN Outcome: Progressing   Problem: Coping: Goal: Level of anxiety will decrease 10/01/2024 1328 by Jenel Bobetta SAILOR, RN Outcome: Progressing 10/01/2024 1328 by Jenel Bobetta SAILOR, RN Outcome: Progressing   Problem: Elimination: Goal: Will not experience complications related to bowel motility 10/01/2024 1328 by Jenel Bobetta SAILOR, RN Outcome: Progressing 10/01/2024 1328 by Jenel Bobetta SAILOR, RN Outcome: Progressing Goal: Will not experience complications related to urinary retention 10/01/2024 1328 by Jenel Bobetta SAILOR, RN Outcome: Progressing 10/01/2024 1328 by Jenel Bobetta SAILOR, RN Outcome: Progressing   Problem: Pain Managment: Goal: General experience of comfort will improve and/or be controlled 10/01/2024 1328 by Jenel Bobetta SAILOR, RN Outcome: Progressing 10/01/2024 1328 by Jenel Bobetta SAILOR, RN Outcome: Progressing   Problem: Safety: Goal: Ability to remain free from injury will improve 10/01/2024 1328 by Jenel Bobetta SAILOR, RN Outcome: Progressing 10/01/2024 1328 by Jenel Bobetta SAILOR, RN Outcome: Progressing   Problem: Skin Integrity: Goal: Risk for impaired skin integrity will decrease 10/01/2024 1328 by Jenel Bobetta SAILOR, RN Outcome: Progressing 10/01/2024 1328 by Jenel Bobetta SAILOR, RN Outcome: Progressing   Problem: Education: Goal: Ability to describe self-care measures that may prevent or decrease complications (Diabetes  Survival Skills Education) will improve 10/01/2024 1328 by Jenel Bobetta SAILOR, RN Outcome: Progressing 10/01/2024 1328 by Jenel Bobetta SAILOR, RN Outcome: Progressing Goal: Individualized Educational Video(s) 10/01/2024 1328 by Jenel Bobetta SAILOR, RN Outcome: Progressing 10/01/2024 1328 by Jenel Bobetta SAILOR, RN Outcome: Progressing   Problem: Coping: Goal: Ability to adjust to condition or change in health will improve 10/01/2024 1328 by Jenel Bobetta SAILOR, RN Outcome: Progressing 10/01/2024 1328 by Jenel Bobetta SAILOR, RN Outcome: Progressing   Problem: Fluid Volume: Goal: Ability to maintain a balanced intake and output will improve 10/01/2024 1328 by Jenel Bobetta SAILOR, RN Outcome: Progressing 10/01/2024 1328 by Jenel Bobetta SAILOR, RN Outcome: Progressing   Problem: Health Behavior/Discharge Planning: Goal: Ability to identify and utilize available resources and services will improve 10/01/2024 1328 by Jenel Bobetta SAILOR, RN Outcome: Progressing 10/01/2024 1328 by Jenel Bobetta SAILOR, RN Outcome: Progressing Goal: Ability to manage health-related needs will improve 10/01/2024 1328 by Jenel Bobetta SAILOR, RN Outcome: Progressing 10/01/2024 1328 by Jenel Bobetta SAILOR, RN Outcome: Progressing   Problem: Metabolic: Goal: Ability to maintain appropriate glucose levels will improve Outcome: Progressing   Problem: Nutritional: Goal: Maintenance of adequate nutrition will improve Outcome: Progressing Goal: Progress toward achieving an optimal weight will improve Outcome: Progressing   Problem: Skin Integrity: Goal: Risk for impaired skin integrity will decrease Outcome: Progressing   Problem: Tissue Perfusion: Goal: Adequacy of tissue perfusion will improve Outcome: Progressing

## 2024-10-01 NOTE — TOC Progression Note (Signed)
 Transition of Care [facility]) - Progression Note    Patient Details  Name: Adam Stephens MRN: [id number] Date of Birth: 07/08/1946  [facility]) CM/SW Contact  Almarie CHRISTELLA [place], [place] Phone Number: 10/01/2024, 11:17 AM  Clinical Narrative:   CSW following for disposition. CSW updated by [facility] that there is no bed available for patient at this time. CSW to follow.    Expected Discharge Plan: Hospice Medical Facility Barriers to Discharge: Hospice Bed not available               Expected Discharge Plan and Services       Living arrangements for the past 2 months: Assisted Living Facility                                       Social Drivers of Health (SDOH) Interventions SDOH Screenings   Food Insecurity: No Food Insecurity (09/29/2024)  Housing: Low Risk  (09/29/2024)  Transportation Needs: No Transportation Needs (09/29/2024)  Utilities: Not At Risk (09/29/2024)  Financial Resource Strain: Low Risk  (01/02/2023)   Received from [facility]  Physical Activity: Unknown (07/11/2022)   [facility]  Social Connections: Moderately Isolated (12/24/2023)  Stress: No Stress Concern Present (07/11/2022)   Received from [facility]  Tobacco Use: Medium Risk (09/06/2023)   Received from [facility]    Readmission Risk Interventions     No data to display

## 2024-10-02 DIAGNOSIS — I610 Nontraumatic intracerebral hemorrhage in hemisphere, subcortical: Secondary | ICD-10-CM | POA: Diagnosis not present

## 2024-10-02 DIAGNOSIS — Z515 Encounter for palliative care: Secondary | ICD-10-CM

## 2024-10-02 MED ORDER — ONDANSETRON 4 MG PO TBDP: 4.0000 mg | ORAL_TABLET | Freq: Four times a day (QID) | ORAL | 0 refills | Status: DC | PRN

## 2024-10-02 MED ORDER — HALOPERIDOL 0.5 MG PO TABS: 0.5000 mg | ORAL_TABLET | ORAL | 0 refills | Status: DC | PRN

## 2024-10-02 MED ORDER — ACETAMINOPHEN 325 MG PO TABS: 650.0000 mg | ORAL_TABLET | Freq: Four times a day (QID) | ORAL | 0 refills | Status: DC | PRN

## 2024-10-02 MED ORDER — BIOTENE DRY MOUTH MT LIQD: 15.0000 mL | OROMUCOSAL | 0 refills | Status: DC | PRN

## 2024-10-02 MED ORDER — MORPHINE SULFATE (PF) 2 MG/ML IV SOLN: 1.0000 mg | INTRAVENOUS | 0 refills | Status: DC | PRN

## 2024-10-02 MED ORDER — POLYVINYL ALCOHOL 1.4 % OP SOLN: 1.0000 [drp] | Freq: Four times a day (QID) | OPHTHALMIC | 0 refills | Status: DC | PRN

## 2024-10-02 MED ORDER — GLYCOPYRROLATE 1 MG PO TABS: 1.0000 mg | ORAL_TABLET | ORAL | 0 refills | Status: DC | PRN

## 2024-10-02 MED ORDER — LORAZEPAM 2 MG/ML IJ SOLN: 1.0000 mg | INTRAMUSCULAR | 0 refills | Status: DC | PRN

## 2024-10-02 NOTE — Plan of Care (Signed)
 Problem: Education: Goal: Knowledge of disease or condition will improve 10/02/2024 1310 by Jenel Bobetta SAILOR, RN Outcome: Adequate for Discharge 10/02/2024 1153 by Jenel Bobetta SAILOR, RN Outcome: Progressing Goal: Knowledge of secondary prevention will improve (MUST DOCUMENT ALL) 10/02/2024 1310 by Jenel Bobetta SAILOR, RN Outcome: Adequate for Discharge 10/02/2024 1153 by Jenel Bobetta SAILOR, RN Outcome: Progressing Goal: Knowledge of patient specific risk factors will improve (DELETE if not current risk factor) 10/02/2024 1310 by Jenel Bobetta SAILOR, RN Outcome: Adequate for Discharge 10/02/2024 1153 by Jenel Bobetta SAILOR, RN Outcome: Progressing   Problem: Intracerebral Hemorrhage Tissue Perfusion: Goal: Complications of Intracerebral Hemorrhage will be minimized 10/02/2024 1310 by Jenel Bobetta SAILOR, RN Outcome: Adequate for Discharge 10/02/2024 1153 by Jenel Bobetta SAILOR, RN Outcome: Progressing   Problem: Coping: Goal: Will verbalize positive feelings about self 10/02/2024 1310 by Jenel Bobetta SAILOR, RN Outcome: Adequate for Discharge 10/02/2024 1153 by Jenel Bobetta SAILOR, RN Outcome: Progressing Goal: Will identify appropriate support needs 10/02/2024 1310 by Jenel Bobetta SAILOR, RN Outcome: Adequate for Discharge 10/02/2024 1153 by Jenel Bobetta SAILOR, RN Outcome: Progressing   Problem: Health Behavior/Discharge Planning: Goal: Ability to manage health-related needs will improve 10/02/2024 1310 by Jenel Bobetta SAILOR, RN Outcome: Adequate for Discharge 10/02/2024 1153 by Jenel Bobetta SAILOR, RN Outcome: Progressing Goal: Goals will be collaboratively established with patient/family 10/02/2024 1310 by Jenel Bobetta SAILOR, RN Outcome: Adequate for Discharge 10/02/2024 1153 by Jenel Bobetta SAILOR, RN Outcome: Progressing   Problem: Self-Care: Goal: Ability to participate in self-care as condition permits will improve 10/02/2024 1310 by Jenel Bobetta SAILOR, RN Outcome: Adequate for Discharge 10/02/2024 1153 by Jenel Bobetta SAILOR, RN Outcome: Progressing Goal: Verbalization of feelings and concerns over difficulty with self-care will improve 10/02/2024 1310 by Jenel Bobetta SAILOR, RN Outcome: Adequate for Discharge 10/02/2024 1153 by Jenel Bobetta SAILOR, RN Outcome: Progressing Goal: Ability to communicate needs accurately will improve 10/02/2024 1310 by Jenel Bobetta SAILOR, RN Outcome: Adequate for Discharge 10/02/2024 1153 by Jenel Bobetta SAILOR, RN Outcome: Progressing   Problem: Nutrition: Goal: Risk of aspiration will decrease 10/02/2024 1310 by Jenel Bobetta SAILOR, RN Outcome: Adequate for Discharge 10/02/2024 1153 by Jenel Bobetta SAILOR, RN Outcome: Progressing Goal: Dietary intake will improve 10/02/2024 1310 by Jenel Bobetta SAILOR, RN Outcome: Adequate for Discharge 10/02/2024 1153 by Jenel Bobetta SAILOR, RN Outcome: Progressing   Problem: Education: Goal: Knowledge of General Education information will improve Description: Including pain rating scale, medication(s)/side effects and non-pharmacologic comfort measures 10/02/2024 1310 by Jenel Bobetta SAILOR, RN Outcome: Adequate for Discharge 10/02/2024 1153 by Jenel Bobetta SAILOR, RN Outcome: Progressing   Problem: Health Behavior/Discharge Planning: Goal: Ability to manage health-related needs will improve 10/02/2024 1310 by Jenel Bobetta SAILOR, RN Outcome: Adequate for Discharge 10/02/2024 1153 by Jenel Bobetta SAILOR, RN Outcome: Progressing   Problem: Clinical Measurements: Goal: Ability to maintain clinical measurements within normal limits will improve 10/02/2024 1310 by Jenel Bobetta SAILOR, RN Outcome: Adequate for Discharge 10/02/2024 1153 by Jenel Bobetta SAILOR, RN Outcome: Progressing Goal: Will remain free from infection 10/02/2024 1310 by Jenel Bobetta SAILOR, RN Outcome: Adequate for Discharge 10/02/2024 1153 by Jenel Bobetta SAILOR, RN Outcome: Progressing Goal: Diagnostic test results will improve 10/02/2024 1310 by Jenel Bobetta SAILOR, RN Outcome: Adequate for  Discharge 10/02/2024 1153 by Jenel Bobetta SAILOR, RN Outcome: Progressing Goal: Respiratory complications will improve 10/02/2024 1310 by Jenel Bobetta SAILOR, RN Outcome: Adequate for Discharge 10/02/2024 1153 by Jenel Bobetta SAILOR, RN Outcome: Progressing Goal: Cardiovascular complication will be avoided 10/02/2024 1310 by  Jenel Bobetta SAILOR, RN Outcome: Adequate for Discharge 10/02/2024 1153 by Jenel Bobetta SAILOR, RN Outcome: Progressing   Problem: Activity: Goal: Risk for activity intolerance will decrease 10/02/2024 1310 by Jenel Bobetta SAILOR, RN Outcome: Adequate for Discharge 10/02/2024 1153 by Jenel Bobetta SAILOR, RN Outcome: Progressing   Problem: Nutrition: Goal: Adequate nutrition will be maintained 10/02/2024 1310 by Jenel Bobetta SAILOR, RN Outcome: Adequate for Discharge 10/02/2024 1153 by Jenel Bobetta SAILOR, RN Outcome: Progressing   Problem: Coping: Goal: Level of anxiety will decrease 10/02/2024 1310 by Jenel Bobetta SAILOR, RN Outcome: Adequate for Discharge 10/02/2024 1153 by Jenel Bobetta SAILOR, RN Outcome: Progressing   Problem: Elimination: Goal: Will not experience complications related to bowel motility 10/02/2024 1310 by Jenel Bobetta SAILOR, RN Outcome: Adequate for Discharge 10/02/2024 1153 by Jenel Bobetta SAILOR, RN Outcome: Progressing Goal: Will not experience complications related to urinary retention 10/02/2024 1310 by Jenel Bobetta SAILOR, RN Outcome: Adequate for Discharge 10/02/2024 1153 by Jenel Bobetta SAILOR, RN Outcome: Progressing   Problem: Pain Managment: Goal: General experience of comfort will improve and/or be controlled 10/02/2024 1310 by Jenel Bobetta SAILOR, RN Outcome: Adequate for Discharge 10/02/2024 1153 by Jenel Bobetta SAILOR, RN Outcome: Progressing   Problem: Safety: Goal: Ability to remain free from injury will improve 10/02/2024 1310 by Jenel Bobetta SAILOR, RN Outcome: Adequate for Discharge 10/02/2024 1153 by Jenel Bobetta SAILOR, RN Outcome: Progressing   Problem: Skin  Integrity: Goal: Risk for impaired skin integrity will decrease 10/02/2024 1310 by Jenel Bobetta SAILOR, RN Outcome: Adequate for Discharge 10/02/2024 1153 by Jenel Bobetta SAILOR, RN Outcome: Progressing   Problem: Education: Goal: Ability to describe self-care measures that may prevent or decrease complications (Diabetes Survival Skills Education) will improve 10/02/2024 1310 by Jenel Bobetta SAILOR, RN Outcome: Adequate for Discharge 10/02/2024 1153 by Jenel Bobetta SAILOR, RN Outcome: Progressing Goal: Individualized Educational Video(s) 10/02/2024 1310 by Jenel Bobetta SAILOR, RN Outcome: Adequate for Discharge 10/02/2024 1153 by Jenel Bobetta SAILOR, RN Outcome: Progressing   Problem: Coping: Goal: Ability to adjust to condition or change in health will improve 10/02/2024 1310 by Jenel Bobetta SAILOR, RN Outcome: Adequate for Discharge 10/02/2024 1153 by Jenel Bobetta SAILOR, RN Outcome: Progressing   Problem: Fluid Volume: Goal: Ability to maintain a balanced intake and output will improve 10/02/2024 1310 by Jenel Bobetta SAILOR, RN Outcome: Adequate for Discharge 10/02/2024 1153 by Jenel Bobetta SAILOR, RN Outcome: Progressing   Problem: Health Behavior/Discharge Planning: Goal: Ability to identify and utilize available resources and services will improve 10/02/2024 1310 by Jenel Bobetta SAILOR, RN Outcome: Adequate for Discharge 10/02/2024 1153 by Jenel Bobetta SAILOR, RN Outcome: Progressing Goal: Ability to manage health-related needs will improve 10/02/2024 1310 by Jenel Bobetta SAILOR, RN Outcome: Adequate for Discharge 10/02/2024 1153 by Jenel Bobetta SAILOR, RN Outcome: Progressing   Problem: Metabolic: Goal: Ability to maintain appropriate glucose levels will improve 10/02/2024 1310 by Jenel Bobetta SAILOR, RN Outcome: Adequate for Discharge 10/02/2024 1153 by Jenel Bobetta SAILOR, RN Outcome: Progressing   Problem: Nutritional: Goal: Maintenance of adequate nutrition will improve 10/02/2024 1310 by Jenel Bobetta SAILOR,  RN Outcome: Adequate for Discharge 10/02/2024 1153 by Jenel Bobetta SAILOR, RN Outcome: Progressing Goal: Progress toward achieving an optimal weight will improve 10/02/2024 1310 by Jenel Bobetta SAILOR, RN Outcome: Adequate for Discharge 10/02/2024 1153 by Jenel Bobetta SAILOR, RN Outcome: Progressing   Problem: Skin Integrity: Goal: Risk for impaired skin integrity will decrease 10/02/2024 1310 by Jenel Bobetta SAILOR, RN Outcome: Adequate for Discharge 10/02/2024 1153 by Jenel Bobetta SAILOR,  RN Outcome: Progressing   Problem: Tissue Perfusion: Goal: Adequacy of tissue perfusion will improve 10/02/2024 1310 by Jenel Bobetta SAILOR, RN Outcome: Adequate for Discharge 10/02/2024 1153 by Jenel Bobetta SAILOR, RN Outcome: Progressing   Problem: Education: Goal: Knowledge of the prescribed therapeutic regimen will improve 10/02/2024 1310 by Jenel Bobetta SAILOR, RN Outcome: Adequate for Discharge 10/02/2024 1153 by Jenel Bobetta SAILOR, RN Outcome: Progressing   Problem: Coping: Goal: Ability to identify and develop effective coping behavior will improve 10/02/2024 1310 by Jenel Bobetta SAILOR, RN Outcome: Adequate for Discharge 10/02/2024 1153 by Jenel Bobetta SAILOR, RN Outcome: Progressing   Problem: Clinical Measurements: Goal: Quality of life will improve 10/02/2024 1310 by Jenel Bobetta SAILOR, RN Outcome: Adequate for Discharge 10/02/2024 1153 by Jenel Bobetta SAILOR, RN Outcome: Progressing   Problem: Respiratory: Goal: Verbalizations of increased ease of respirations will increase 10/02/2024 1310 by Jenel Bobetta SAILOR, RN Outcome: Adequate for Discharge 10/02/2024 1153 by Jenel Bobetta SAILOR, RN Outcome: Progressing   Problem: Role Relationship: Goal: Family's ability to cope with current situation will improve 10/02/2024 1310 by Jenel Bobetta SAILOR, RN Outcome: Adequate for Discharge 10/02/2024 1153 by Jenel Bobetta SAILOR, RN Outcome: Progressing Goal: Ability to verbalize concerns, feelings, and thoughts to partner or family  member will improve 10/02/2024 1310 by Jenel Bobetta SAILOR, RN Outcome: Adequate for Discharge 10/02/2024 1153 by Jenel Bobetta SAILOR, RN Outcome: Progressing   Problem: Pain Management: Goal: Satisfaction with pain management regimen will improve 10/02/2024 1310 by Jenel Bobetta SAILOR, RN Outcome: Adequate for Discharge 10/02/2024 1153 by Jenel Bobetta SAILOR, RN Outcome: Progressing

## 2024-10-02 NOTE — Progress Notes (Signed)
 Patient going to [facility] in [facility]. Report given to Tillman, CHARITY FUNDRAISER.

## 2024-10-02 NOTE — Progress Notes (Signed)
 Daily Progress Note   Patient Name: Adam Stephens       Date: 10/02/2024 DOB: 01-12-1946  Age: 78 y.o. MRN#: [id number] Attending Physician: Stroke, Md, MD Primary Care Physician: Pcp, No Admit Date: 09/28/2024  Reason for Consultation/Follow-up: Establishing goals of care  Subjective: Resting comfortably, unresponsive, daughter Niels at bedside  Length of Stay: 4  Current Medications: Scheduled Meds:   Chlorhexidine Gluconate Cloth  6 each Topical Daily    Continuous Infusions:   PRN Meds: acetaminophen  **OR** acetaminophen , antiseptic oral rinse, artificial tears, glycopyrrolate **OR** glycopyrrolate **OR** glycopyrrolate, haloperidol **OR** haloperidol **OR** haloperidol lactate, LORazepam, morphine injection, ondansetron **OR** ondansetron (ZOFRAN) IV, mouth rinse  Physical Exam Constitutional:      General: He is not in acute distress.    Appearance: He is ill-appearing.     Comments: Appears comfortable Unresponsive Warm to touch No grimacing, eyebrow furrowing  Pulmonary:     Effort: Pulmonary effort is normal.     Comments: Regular breathing Skin:    General: Skin is warm and dry.             Vital Signs: BP (!) 152/71 (BP Location: Right Wrist)   Pulse 95   Temp 98.7 F (37.1 C) (Axillary)   Resp 18   Ht 5' 10 (1.778 m)   Wt 60.4 kg   SpO2 94%   BMI 19.11 kg/m  SpO2: SpO2: 94 % O2 Device: O2 Device: Room Air O2 Flow Rate: O2 Flow Rate (L/min): 2 L/min  Intake/output summary:  Intake/Output Summary (Last 24 hours) at 10/02/2024 0931 Last data filed at 10/01/2024 1819 Gross per 24 hour  Intake --  Output 400 ml  Net -400 ml   LBM: Last BM Date : 09/30/24 Baseline Weight: Weight: 62.2 kg Most recent weight: Weight: 60.4 kg       Palliative  Assessment/Data: PPS 10%      Patient Active Problem List   Diagnosis Date Noted   ICH (intracerebral hemorrhage) (HCC) 09/28/2024   TIA (transient ischemic attack) 12/22/2023   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 male   admitted on 09/28/2024 with  PMH for HTN, HLD, A fib on Eliquis  presents from memory care  with AMS and possible seizure.     He was found to have ICH, maintaining his airways and anticoagulation revereed and he was loaded with Keppra. PCCM called as he has been persistently hypotensive and bradycardia to HR high 30's. UA consistent with UTI.    Neuro exams remains poor. No command following. Does not open eyes to voice/noxious stimuli. Moves BLE spontaneously.    Admitted for treatment and stabilization.   Family face treatment option decisions, advanced directive decisions and anticipatory care needs.    Assessment: Daughter at bedside reports patient has been comfortable.  Has needed occasional doses of morphine and ativan - tells me morphine alone doesn't work well but when paired with ativan gives him long periods of comfortable rest. We discuss we could increase morphine dose as he  is only receiving 1 mg but she feels current dose is fine when given with ativan so will leave meds as they are. Continue to await hospice facility placement.  Family has our number and will call as needed  Recommendations/Plan: Continue comfort measures only  Morphine 1 mg IV every 1 hour as needed for pain or dyspnea   Ativan 1 mg IV every 4 hours as needed for agitation, or anxiety  await hospice facility bed  Goals of Care and Additional Recommendations: Limitations on Scope of Treatment: Full Comfort Care  Code Status: DNR  Prognosis:  < 2 weeks  Discharge Planning: Hospice  facility  Care plan was discussed with daughter at bedside  Thank you for allowing the Palliative Medicine Team to assist in the care of this patient.   Total Time 25 minutes Prolonged Time Billed  no   Time spent includes: Detailed review of medical records (labs, imaging, vital signs), medically appropriate exam, discussion with treatment team, counseling and educating patient, family and/or staff, documenting clinical information, medication management and coordination of care.     *Please note that this is a verbal dictation therefore any spelling or grammatical errors are due to the Dragon Medical One system interpretation.  Tobey Jama Barnacle, DNP, [facility] Palliative Medicine Team Team Phone # [phone]  Pager [phone]

## 2024-10-02 NOTE — Plan of Care (Signed)
  Problem: Education: Goal: Knowledge of disease or condition will improve Outcome: Adequate for Discharge Goal: Knowledge of secondary prevention will improve (MUST DOCUMENT ALL) Outcome: Adequate for Discharge Goal: Knowledge of patient specific risk factors will improve (DELETE if not current risk factor) Outcome: Adequate for Discharge   Problem: Coping: Goal: Will verbalize positive feelings about self Outcome: Adequate for Discharge Goal: Will identify appropriate support needs Outcome: Adequate for Discharge

## 2024-10-02 NOTE — Plan of Care (Signed)
 Problem: Education: Goal: Knowledge of disease or condition will improve Outcome: Progressing Goal: Knowledge of secondary prevention will improve (MUST DOCUMENT ALL) Outcome: Progressing Goal: Knowledge of patient specific risk factors will improve (DELETE if not current risk factor) Outcome: Progressing   Problem: Intracerebral Hemorrhage Tissue Perfusion: Goal: Complications of Intracerebral Hemorrhage will be minimized Outcome: Progressing   Problem: Coping: Goal: Will verbalize positive feelings about self Outcome: Progressing Goal: Will identify appropriate support needs Outcome: Progressing   Problem: Health Behavior/Discharge Planning: Goal: Ability to manage health-related needs will improve Outcome: Progressing Goal: Goals will be collaboratively established with patient/family Outcome: Progressing   Problem: Self-Care: Goal: Ability to participate in self-care as condition permits will improve Outcome: Progressing Goal: Verbalization of feelings and concerns over difficulty with self-care will improve Outcome: Progressing Goal: Ability to communicate needs accurately will improve Outcome: Progressing   Problem: Nutrition: Goal: Risk of aspiration will decrease Outcome: Progressing Goal: Dietary intake will improve Outcome: Progressing   Problem: Education: Goal: Knowledge of General Education information will improve Description: Including pain rating scale, medication(s)/side effects and non-pharmacologic comfort measures Outcome: Progressing   Problem: Health Behavior/Discharge Planning: Goal: Ability to manage health-related needs will improve Outcome: Progressing   Problem: Clinical Measurements: Goal: Ability to maintain clinical measurements within normal limits will improve Outcome: Progressing Goal: Will remain free from infection Outcome: Progressing Goal: Diagnostic test results will improve Outcome: Progressing Goal: Respiratory  complications will improve Outcome: Progressing Goal: Cardiovascular complication will be avoided Outcome: Progressing   Problem: Activity: Goal: Risk for activity intolerance will decrease Outcome: Progressing   Problem: Nutrition: Goal: Adequate nutrition will be maintained Outcome: Progressing   Problem: Coping: Goal: Level of anxiety will decrease Outcome: Progressing   Problem: Elimination: Goal: Will not experience complications related to bowel motility Outcome: Progressing Goal: Will not experience complications related to urinary retention Outcome: Progressing   Problem: Pain Managment: Goal: General experience of comfort will improve and/or be controlled Outcome: Progressing   Problem: Safety: Goal: Ability to remain free from injury will improve Outcome: Progressing   Problem: Skin Integrity: Goal: Risk for impaired skin integrity will decrease Outcome: Progressing   Problem: Education: Goal: Ability to describe self-care measures that may prevent or decrease complications (Diabetes Survival Skills Education) will improve Outcome: Progressing Goal: Individualized Educational Video(s) Outcome: Progressing   Problem: Coping: Goal: Ability to adjust to condition or change in health will improve Outcome: Progressing   Problem: Fluid Volume: Goal: Ability to maintain a balanced intake and output will improve Outcome: Progressing   Problem: Health Behavior/Discharge Planning: Goal: Ability to identify and utilize available resources and services will improve Outcome: Progressing Goal: Ability to manage health-related needs will improve Outcome: Progressing   Problem: Metabolic: Goal: Ability to maintain appropriate glucose levels will improve Outcome: Progressing   Problem: Nutritional: Goal: Maintenance of adequate nutrition will improve Outcome: Progressing Goal: Progress toward achieving an optimal weight will improve Outcome: Progressing    Problem: Skin Integrity: Goal: Risk for impaired skin integrity will decrease Outcome: Progressing   Problem: Tissue Perfusion: Goal: Adequacy of tissue perfusion will improve Outcome: Progressing   Problem: Education: Goal: Knowledge of the prescribed therapeutic regimen will improve Outcome: Progressing   Problem: Coping: Goal: Ability to identify and develop effective coping behavior will improve Outcome: Progressing   Problem: Clinical Measurements: Goal: Quality of life will improve Outcome: Progressing   Problem: Respiratory: Goal: Verbalizations of increased ease of respirations will increase Outcome: Progressing   Problem: Role Relationship: Goal: Family's ability to  cope with current situation will improve Outcome: Progressing Goal: Ability to verbalize concerns, feelings, and thoughts to partner or family member will improve Outcome: Progressing   Problem: Pain Management: Goal: Satisfaction with pain management regimen will improve Outcome: Progressing

## 2024-10-02 NOTE — Progress Notes (Signed)
 Patient appears comfortable with PRN morphine and ativan. Wife & daughter at bedside. Plan is to discharge to hospice house when bed becomes available.

## 2024-10-02 NOTE — TOC Transition Note (Signed)
 Transition of Care [facility]) - Discharge Note   Patient Details  Name: Adam Stephens MRN: [id number] Date of Birth: [birth year]  [facility] CM/SW Contact:  Almarie CHRISTELLA Goodie, LCSW Phone Number: 10/02/2024, 1:40 PM   Clinical Narrative:   CSW updated by [facility] that patient has a bed available. CSW updated MD, and discharge information sent. Family in agreement, consents complete. Transport arranged with [facility] for next available.  Nurse to call report to [phone].    Final next level of care: Hospice Medical Facility Barriers to Discharge: Barriers Resolved   Patient Goals and CMS Choice   CMS Medicare.gov Compare Post Acute Care list provided to:: Patient Represenative (must comment) Choice offered to / list presented to : Adult Children      Discharge Placement              Patient chooses bed at:  [facility]) Patient to be transferred to facility by: [facility] Name of family member notified: Family at bedside Patient and family notified of of transfer: 10/02/24  Discharge Plan and Services Additional resources added to the After Visit Summary for                                       Social Drivers of Health (SDOH) Interventions SDOH Screenings   Food Insecurity: No Food Insecurity (09/29/2024)  Housing: Low Risk  (09/29/2024)  Transportation Needs: No Transportation Needs (09/29/2024)  Utilities: Not At Risk (09/29/2024)  Financial Resource Strain: Low Risk  (01/02/2023)   Received from [facility]  Physical Activity: Unknown (07/11/2022)   Received from [facility]  Social Connections: Moderately Isolated (12/24/2023)  Stress: No Stress Concern Present (07/11/2022)   Received from [facility]  Tobacco Use: Medium Risk (09/06/2023)   Received from [facility]     Readmission Risk Interventions     No data to display

## 2024-10-20 DEATH — deceased
